# Patient Record
Sex: Female | Born: 1960 | Smoking: Current every day smoker
Health system: Southern US, Community
[De-identification: ages and names within clinical notes are randomized; demographics above are authoritative.]

## PROBLEM LIST (undated history)

## (undated) DIAGNOSIS — C801 Malignant (primary) neoplasm, unspecified: Secondary | ICD-10-CM

## (undated) DIAGNOSIS — R519 Headache, unspecified: Secondary | ICD-10-CM

## (undated) DIAGNOSIS — Z9289 Personal history of other medical treatment: Secondary | ICD-10-CM

## (undated) DIAGNOSIS — F32A Depression, unspecified: Secondary | ICD-10-CM

## (undated) DIAGNOSIS — Z8489 Family history of other specified conditions: Secondary | ICD-10-CM

## (undated) DIAGNOSIS — J189 Pneumonia, unspecified organism: Secondary | ICD-10-CM

## (undated) DIAGNOSIS — M1990 Unspecified osteoarthritis, unspecified site: Secondary | ICD-10-CM

## (undated) DIAGNOSIS — F329 Major depressive disorder, single episode, unspecified: Secondary | ICD-10-CM

## (undated) DIAGNOSIS — K219 Gastro-esophageal reflux disease without esophagitis: Secondary | ICD-10-CM

## (undated) DIAGNOSIS — F419 Anxiety disorder, unspecified: Secondary | ICD-10-CM

## (undated) DIAGNOSIS — I10 Essential (primary) hypertension: Secondary | ICD-10-CM

## (undated) DIAGNOSIS — A6920 Lyme disease, unspecified: Secondary | ICD-10-CM

## (undated) HISTORY — PX: COLONOSCOPY: SHX174

## (undated) HISTORY — PX: KNEE SURGERY: SHX244

---
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
## 1996-05-16 HISTORY — PX: TIBIA FRACTURE SURGERY: SHX806

## 1996-05-16 HISTORY — PX: SPLENECTOMY, PARTIAL: SHX787

## 2016-07-14 DIAGNOSIS — A6920 Lyme disease, unspecified: Secondary | ICD-10-CM

## 2016-07-14 HISTORY — DX: Lyme disease, unspecified: A69.20

## 2018-04-15 HISTORY — PX: OTHER SURGICAL HISTORY: SHX169

## 2019-01-30 ENCOUNTER — Other Ambulatory Visit: Payer: Self-pay | Admitting: Neurological Surgery

## 2019-02-22 ENCOUNTER — Other Ambulatory Visit: Payer: Self-pay | Admitting: Neurological Surgery

## 2019-02-22 NOTE — Progress Notes (Signed)
[id number] - [place], [place] - [address] [place] [address] Phone: [phone] Fax: [phone]      Your procedure is scheduled on Wednesday, Oct. 14th.  Report to [facility] Main Entrance "A" at 12:25 A.M., and check in at the Admitting office.  Call this number if you have problems the morning of surgery:  [phone]  Call [phone] if you have any questions prior to your surgery date Monday-Friday 8am-4pm    Remember:  Do not eat or drink after midnight the night before your surgery     Take these medicines the morning of surgery with A SIP OF WATER   Fluoxetine (Prozac)  Ceftin  Doxycycline  Famotidine (Pepcid)  7 days prior to surgery STOP taking any Aspirin (unless otherwise instructed by your surgeon), Aleve, Naproxen, Ibuprofen, Motrin, Advil, Goody's, BC's, all herbal medications, fish oil, and all vitamins.    The Morning of Surgery  Do not wear jewelry, make-up or nail polish.  Do not wear lotions, powders, or perfumes, or deodorant  Do not shave 48 hours prior to surgery.    Do not bring valuables to the hospital.  [facility] is not responsible for any belongings or valuables.  If you are a smoker, DO NOT Smoke 24 hours prior to surgery IF you wear a CPAP at night please bring your mask, tubing, and machine the morning of surgery   Remember that you must have someone to transport you home after your surgery, and remain with you for 24 hours if you are discharged the same day.   Contacts, glasses, hearing aids, dentures or bridgework may not be worn into surgery.    Leave your suitcase in the car.  After surgery it may be brought to your room.  For patients admitted to the hospital, discharge time will be determined by your treatment team.  Patients discharged the day of surgery will not be allowed to drive home.    Special instructions:   [facility]- Preparing For Surgery  Before surgery, you can  play an important role. Because skin is not sterile, your skin needs to be as free of germs as possible. You can reduce the number of germs on your skin by washing with CHG (chlorahexidine gluconate) Soap before surgery.  CHG is an antiseptic cleaner which kills germs and bonds with the skin to continue killing germs even after washing.    Oral Hygiene is also important to reduce your risk of infection.  Remember - BRUSH YOUR TEETH THE MORNING OF SURGERY WITH YOUR REGULAR TOOTHPASTE  Please do not use if you have an allergy to CHG or antibacterial soaps. If your skin becomes reddened/irritated stop using the CHG.  Do not shave (including legs and underarms) for at least 48 hours prior to first CHG shower. It is OK to shave your face.  Please follow these instructions carefully.   1. Shower the NIGHT BEFORE SURGERY and the MORNING OF SURGERY with CHG Soap.   2. If you chose to wash your hair, wash your hair first as usual with your normal shampoo.  3. After you shampoo, rinse your hair and body thoroughly to remove the shampoo.  4. Use CHG as you would any other liquid soap. You can apply CHG directly to the skin and wash gently with a scrungie or a clean washcloth.   5. Apply the CHG Soap to your body ONLY FROM THE NECK DOWN.  Do not use on open wounds or  open sores. Avoid contact with your eyes, ears, mouth and genitals (private parts). Wash Face and genitals (private parts)  with your normal soap.   6. Wash thoroughly, paying special attention to the area where your surgery will be performed.  7. Thoroughly rinse your body with warm water from the neck down.  8. DO NOT shower/wash with your normal soap after using and rinsing off the CHG Soap.  9. Pat yourself dry with a CLEAN TOWEL.  10. Wear CLEAN PAJAMAS to bed the night before surgery, wear comfortable clothes the morning of surgery  11. Place CLEAN SHEETS on your bed the night of your first shower and DO NOT SLEEP WITH  PETS.    Day of Surgery:  Do not apply any deodorants/lotions. Please shower the morning of surgery with the CHG soap  Please wear clean clothes to the hospital/surgery center.   Remember to brush your teeth WITH YOUR REGULAR TOOTHPASTE.   Please read over the following fact sheets that you were given.

## 2019-02-22 NOTE — Progress Notes (Signed)
Your procedure is scheduled on Wednesday, Oct. 14th.  Report to [facility] Main Entrance "A" at 12:25 A.M., and check in at the Admitting office.             Your surgery or procedure is scheduled for 2:45 PM   Call this number if you have problems the morning of surgery:  [phone]  Call [phone] if you have any questions prior to your surgery date Monday-Friday 8am-4pm   Remember:  Do not eat or drink after midnight the night before your surgery   Take these medicines the morning of surgery with A SIP OF WATER   Fluoxetine (Prozac)  Ceftin  Doxycycline  Famotidine (Pepcid)  7 days prior to surgery STOP taking any Aspirin (unless otherwise instructed by your surgeon), Aleve, Naproxen, Ibuprofen, Motrin, Advil, Goody's, BC's, all herbal medications, fish oil, and all vitamins.    The Morning of Surgery  Do not wear jewelry, make-up or nail polish.  Do not wear lotions, powders, or perfumes, or deodorant  Do not shave 48 hours prior to surgery.    Do not bring valuables to the hospital.  [facility] is not responsible for any belongings or valuables.  If you are a smoker, DO NOT Smoke 24 hours prior to surgery IF you wear a CPAP at night please bring your mask, tubing, and machine the morning of surgery   Remember that you must have someone to transport you home after your surgery, and remain with you for 24 hours if you are discharged the same day.   Contacts, glasses, hearing aids, dentures or bridgework may not be worn into surgery.    Leave your suitcase in the car.  After surgery it may be brought to your room.  For patients admitted to the hospital, discharge time will be determined by your treatment team.  Patients discharged the day of surgery will not be allowed to drive home.    Special instructions:   [facility]- Preparing For Surgery  Before surgery, you can play an important role. Because skin is not sterile, your skin needs to be as free of  germs as possible. You can reduce the number of germs on your skin by washing with CHG (chlorahexidine gluconate) Soap before surgery.  CHG is an antiseptic cleaner which kills germs and bonds with the skin to continue killing germs even after washing.    Oral Hygiene is also important to reduce your risk of infection.  Remember - BRUSH YOUR TEETH THE MORNING OF SURGERY WITH YOUR REGULAR TOOTHPASTE  Please do not use if you have an allergy to CHG or antibacterial soaps. If your skin becomes reddened/irritated stop using the CHG.  Do not shave (including legs and underarms) for at least 48 hours prior to first CHG shower. It is OK to shave your face.  Please follow these instructions carefully.   1. Shower the NIGHT BEFORE SURGERY and the MORNING OF SURGERY with CHG Soap.   2. If you chose to wash your hair, wash your hair first as usual with your normal shampoo.  3. After you shampoo, wash your face and private area with the soap you use at home, then rinse your hair and body thoroughly to remove the shampoo and soap. \  4. Use CHG as you would any other liquid soap. You can apply CHG directly to the skin and wash gently with a scrungie or a clean washcloth.   5. Apply the CHG Soap to your body  ONLY FROM THE NECK DOWN.  Do not use on open wounds or open sores. Avoid contact with your eyes, ears, mouth and genitals (private parts).   6. Wash thoroughly, paying special attention to the area where your surgery will be performed.  7. Thoroughly rinse your body with warm water from the neck down.  8. DO NOT shower/wash with your normal soap after using and rinsing off the CHG Soap.  9. Pat yourself dry with a CLEAN TOWEL.  10. Wear CLEAN PAJAMAS to bed the night before surgery, wear comfortable clothes the morning of surgery  11. Place CLEAN SHEETS on your bed the night of your first shower and DO NOT SLEEP WITH PETS.  Day of Surgery: Shower as instructed above. Do not apply any  deodorants/lotions, powders and colognes. Please shower the morning of surgery with the CHG soap  Please wear clean clothes to the hospital/surgery center.   Remember to brush your teeth WITH YOUR REGULAR TOOTHPASTE.   Do not wear jewelry, make-up or nail polish.  Do not shave 48 hours prior to surgery.    Do not bring valuables to the hospital.  [facility] is not responsible for any belongings or valuables.  If you are a smoker, DO NOT Smoke 24 hours prior to surgery IF you wear a CPAP at night please bring your mask, tubing, and machine the morning of surgery   Remember that you must have someone to transport you home after your surgery, and remain with you for 24 hours if you are discharged the same day.  Contacts, glasses, hearing aids, dentures or bridgework may not be worn into surgery.   Leave your suitcase in the car.  After surgery it may be brought to your room.  For patients admitted to the hospital, discharge time will be determined by your treatment team.  Patients discharged the day of surgery will not be allowed to drive home.   Special instructions:   [facility]- Preparing For Surgery  Before surgery, you can play an important role. Because skin is not sterile, your skin needs to be as free of germs as possible. You can reduce the number of germs on your skin by washing with CHG (chlorahexidine gluconate) Soap before surgery.  CHG is an antiseptic cleaner which kills germs and bonds with the skin to continue killing germs even after washing.    Please read over the following fact sheets that you were given.

## 2019-02-25 ENCOUNTER — Encounter (HOSPITAL_COMMUNITY)
Admission: RE | Admit: 2019-02-25 | Discharge: 2019-02-25 | Disposition: A | Discharge status: Home / Self Care | Payer: Medicare PPO | Source: Ambulatory Visit | Attending: Neurological Surgery | Admitting: Neurological Surgery

## 2019-02-25 ENCOUNTER — Encounter (HOSPITAL_COMMUNITY): Payer: Self-pay

## 2019-02-25 ENCOUNTER — Other Ambulatory Visit: Payer: Self-pay

## 2019-02-25 ENCOUNTER — Other Ambulatory Visit (HOSPITAL_COMMUNITY)
Admission: RE | Admit: 2019-02-25 | Discharge: 2019-02-25 | Disposition: A | Discharge status: Home / Self Care | Payer: Medicare PPO | Source: Ambulatory Visit

## 2019-02-25 ENCOUNTER — Ambulatory Visit (HOSPITAL_COMMUNITY)
Admission: RE | Admit: 2019-02-25 | Discharge: 2019-02-25 | Disposition: A | Discharge status: Home / Self Care | Payer: Medicare PPO | Source: Ambulatory Visit | Attending: Neurological Surgery | Admitting: Neurological Surgery

## 2019-02-25 DIAGNOSIS — M21379 Foot drop, unspecified foot: Secondary | ICD-10-CM

## 2019-02-25 HISTORY — DX: Headache, unspecified: R51.9

## 2019-02-25 HISTORY — DX: Essential (primary) hypertension: I10

## 2019-02-25 HISTORY — DX: Depression, unspecified: F32.A

## 2019-02-25 HISTORY — DX: Gastro-esophageal reflux disease without esophagitis: K21.9

## 2019-02-25 HISTORY — DX: Personal history of other medical treatment: Z92.89

## 2019-02-25 HISTORY — DX: Malignant (primary) neoplasm, unspecified: C80.1

## 2019-02-25 HISTORY — DX: Unspecified osteoarthritis, unspecified site: M19.90

## 2019-02-25 HISTORY — DX: Anxiety disorder, unspecified: F41.9

## 2019-02-25 LAB — CBC WITH DIFFERENTIAL/PLATELET
Abs Immature Granulocytes: 0.03 10*3/uL (ref 0.00–0.07)
Basophils Absolute: 0.1 10*3/uL (ref 0.0–0.1)
Basophils Relative: 1 %
Eosinophils Absolute: 0.2 10*3/uL (ref 0.0–0.5)
Eosinophils Relative: 2 %
HCT: 35.8 % — ABNORMAL LOW (ref 36.0–46.0)
Hemoglobin: 11.6 g/dL — ABNORMAL LOW (ref 12.0–15.0)
Immature Granulocytes: 0 %
Lymphocytes Relative: 31 %
Lymphs Abs: 2.7 10*3/uL (ref 0.7–4.0)
MCH: 33.6 pg (ref 26.0–34.0)
MCHC: 32.4 g/dL (ref 30.0–36.0)
MCV: 103.8 fL — ABNORMAL HIGH (ref 80.0–100.0)
Monocytes Absolute: 0.9 10*3/uL (ref 0.1–1.0)
Monocytes Relative: 10 %
Neutro Abs: 4.9 10*3/uL (ref 1.7–7.7)
Neutrophils Relative %: 56 %
Platelets: 294 10*3/uL (ref 150–400)
RBC: 3.45 MIL/uL — ABNORMAL LOW (ref 3.87–5.11)
RDW: 13.1 % (ref 11.5–15.5)
WBC: 8.7 10*3/uL (ref 4.0–10.5)
nRBC: 0 % (ref 0.0–0.2)

## 2019-02-25 LAB — SURGICAL PCR SCREEN
MRSA, PCR: NEGATIVE
Staphylococcus aureus: NEGATIVE

## 2019-02-25 LAB — BASIC METABOLIC PANEL
Anion gap: 10 (ref 5–15)
BUN: 15 mg/dL (ref 6–20)
CO2: 25 mmol/L (ref 22–32)
Calcium: 9.6 mg/dL (ref 8.9–10.3)
Chloride: 105 mmol/L (ref 98–111)
Creatinine, Ser: 0.99 mg/dL (ref 0.44–1.00)
GFR calc Af Amer: >60 mL/min (ref >60)
GFR calc non Af Amer: >60 mL/min (ref >60)
Glucose, Bld: 89 mg/dL (ref 70–99)
Potassium: 3.3 mmol/L — ABNORMAL LOW (ref 3.5–5.1)
Sodium: 140 mmol/L (ref 135–145)

## 2019-02-25 LAB — PROTIME-INR
INR: 1.1 (ref 0.8–1.2)
Prothrombin Time: 13.6 seconds (ref 11.4–15.2)

## 2019-02-25 LAB — TYPE AND SCREEN
ABO/RH(D): A POS
Antibody Screen: NEGATIVE

## 2019-02-25 LAB — ABO/RH: ABO/RH(D): A POS

## 2019-02-25 NOTE — Progress Notes (Signed)
PCP - [facility]- sees a PA  Cardiologist - no  Chest x-ray - 02/25/2019- pending  EKG - 02/25/2019  Stress Test - noECHO -   Cardiac Cath - no  Sleep Study ->15 years ago- negative CPAP - no  LABS-CBC, BMP, PT/INR, T/S  ASA-no  ERAS-no  HA1C-na Fasting Blood Sugar - na Checks Blood Sugar ____no_ times a day Anesthesia-  Pt denies having chest pain, sob, or fever at this time. All instructions explained to the pt, with a verbal understanding of the material. Pt agrees to go over the instructions while at home for a better understanding. Pt also instructed to self quarantine after being tested for COVID-19. The opportunity to ask questions was provided.

## 2019-02-26 LAB — SARS CORONAVIRUS 2 (TAT 6-24 HRS): SARS Coronavirus 2: NEGATIVE

## 2019-02-27 ENCOUNTER — Other Ambulatory Visit: Payer: Self-pay

## 2019-02-27 ENCOUNTER — Inpatient Hospital Stay (HOSPITAL_COMMUNITY): Payer: Medicare PPO

## 2019-02-27 ENCOUNTER — Inpatient Hospital Stay (HOSPITAL_COMMUNITY)
Admission: RE | Admit: 2019-02-27 | Discharge: 2019-02-28 | DRG: 455 | Disposition: A | Discharge status: Home / Self Care | Payer: Medicare PPO | Attending: Neurological Surgery | Admitting: Neurological Surgery

## 2019-02-27 ENCOUNTER — Inpatient Hospital Stay (HOSPITAL_COMMUNITY): Payer: Medicare PPO | Admitting: Physician Assistant

## 2019-02-27 ENCOUNTER — Inpatient Hospital Stay (HOSPITAL_COMMUNITY): Payer: Medicare PPO | Admitting: Certified Registered"

## 2019-02-27 ENCOUNTER — Encounter (HOSPITAL_COMMUNITY)
Admission: RE | Disposition: A | Discharge status: Home / Self Care | Payer: Self-pay | Source: Home / Self Care | Attending: Neurological Surgery

## 2019-02-27 ENCOUNTER — Encounter (HOSPITAL_COMMUNITY): Payer: Self-pay

## 2019-02-27 DIAGNOSIS — F419 Anxiety disorder, unspecified: Secondary | ICD-10-CM | POA: Diagnosis present

## 2019-02-27 DIAGNOSIS — Z20828 Contact with and (suspected) exposure to other viral communicable diseases: Secondary | ICD-10-CM | POA: Diagnosis present

## 2019-02-27 DIAGNOSIS — Z87891 Personal history of nicotine dependence: Secondary | ICD-10-CM | POA: Diagnosis not present

## 2019-02-27 DIAGNOSIS — I10 Essential (primary) hypertension: Secondary | ICD-10-CM | POA: Diagnosis present

## 2019-02-27 DIAGNOSIS — M5116 Intervertebral disc disorders with radiculopathy, lumbar region: Secondary | ICD-10-CM | POA: Diagnosis present

## 2019-02-27 DIAGNOSIS — Z880 Allergy status to penicillin: Secondary | ICD-10-CM

## 2019-02-27 DIAGNOSIS — M48061 Spinal stenosis, lumbar region without neurogenic claudication: Principal | ICD-10-CM | POA: Diagnosis present

## 2019-02-27 DIAGNOSIS — Z79899 Other long term (current) drug therapy: Secondary | ICD-10-CM | POA: Diagnosis not present

## 2019-02-27 DIAGNOSIS — Z981 Arthrodesis status: Secondary | ICD-10-CM

## 2019-02-27 DIAGNOSIS — Z419 Encounter for procedure for purposes other than remedying health state, unspecified: Secondary | ICD-10-CM

## 2019-02-27 DIAGNOSIS — K219 Gastro-esophageal reflux disease without esophagitis: Secondary | ICD-10-CM | POA: Diagnosis present

## 2019-02-27 DIAGNOSIS — F329 Major depressive disorder, single episode, unspecified: Secondary | ICD-10-CM | POA: Diagnosis present

## 2019-02-27 DIAGNOSIS — M21371 Foot drop, right foot: Secondary | ICD-10-CM | POA: Diagnosis present

## 2019-02-27 DIAGNOSIS — Z9081 Acquired absence of spleen: Secondary | ICD-10-CM

## 2019-02-27 SURGERY — POSTERIOR LUMBAR FUSION 1 LEVEL
Anesthesia: General | Site: Back

## 2019-02-27 MED ORDER — LIDOCAINE 20MG/ML (2%) 15 ML SYRINGE OPTIME
INTRAMUSCULAR | Status: DC | PRN
  Administered 2019-02-27: 80 mg via INTRAVENOUS

## 2019-02-27 MED ORDER — LEVOCETIRIZINE DIHYDROCHLORIDE 5 MG PO TABS: 5.0000 mg | ORAL_TABLET | Freq: Every day | ORAL | Status: DC

## 2019-02-27 MED ORDER — ARTHREX ANGEL - ACD-A SOLUTION (CHARTING ONLY) OPTIME
TOPICAL | Status: DC | PRN
  Administered 2019-02-27: 10 mL via TOPICAL

## 2019-02-27 MED ORDER — PROPOFOL 10 MG/ML IV BOLUS
INTRAVENOUS | Status: AC
  Filled 2019-02-27: qty 20

## 2019-02-27 MED ORDER — PROMETHAZINE HCL 25 MG/ML IJ SOLN: 6.2500 mg | INTRAMUSCULAR | Status: DC | PRN

## 2019-02-27 MED ORDER — VANCOMYCIN HCL IN DEXTROSE 1-5 GM/200ML-% IV SOLN
INTRAVENOUS | Status: AC
  Administered 2019-02-27: 13:00:00 1000 mg via INTRAVENOUS
  Filled 2019-02-27: qty 200

## 2019-02-27 MED ORDER — VANCOMYCIN HCL IN DEXTROSE 1-5 GM/200ML-% IV SOLN
1000.0000 mg | INTRAVENOUS | Status: AC
  Administered 2019-02-27: 13:00:00 1000 mg via INTRAVENOUS

## 2019-02-27 MED ORDER — SODIUM CHLORIDE 0.9 % IV SOLN: 250.0000 mL | INTRAVENOUS | Status: DC

## 2019-02-27 MED ORDER — MORPHINE SULFATE (PF) 2 MG/ML IV SOLN
2.0000 mg | INTRAVENOUS | Status: DC | PRN
  Administered 2019-02-27: 19:00:00 2 mg via INTRAVENOUS
  Filled 2019-02-27: qty 1

## 2019-02-27 MED ORDER — LIDOCAINE 2% (20 MG/ML) 5 ML SYRINGE
INTRAMUSCULAR | Status: AC
  Filled 2019-02-27: qty 5

## 2019-02-27 MED ORDER — ONDANSETRON HCL 4 MG/2ML IJ SOLN
INTRAMUSCULAR | Status: AC
  Filled 2019-02-27: qty 2

## 2019-02-27 MED ORDER — BUPIVACAINE HCL (PF) 0.25 % IJ SOLN
INTRAMUSCULAR | Status: AC
  Filled 2019-02-27: qty 30

## 2019-02-27 MED ORDER — ACETAMINOPHEN 325 MG PO TABS: 650.0000 mg | ORAL_TABLET | ORAL | Status: DC | PRN

## 2019-02-27 MED ORDER — FENTANYL CITRATE (PF) 100 MCG/2ML IJ SOLN
25.0000 ug | INTRAMUSCULAR | Status: DC | PRN
  Administered 2019-02-27: 25 ug via INTRAVENOUS

## 2019-02-27 MED ORDER — SENNA 8.6 MG PO TABS
1.0000 | ORAL_TABLET | Freq: Two times a day (BID) | ORAL | Status: DC
  Administered 2019-02-27 – 2019-02-28 (×2): 8.6 mg via ORAL
  Filled 2019-02-27 (×2): qty 1

## 2019-02-27 MED ORDER — CHLORHEXIDINE GLUCONATE CLOTH 2 % EX PADS: 6.0000 | MEDICATED_PAD | Freq: Once | CUTANEOUS | Status: DC

## 2019-02-27 MED ORDER — SODIUM CHLORIDE (PF) 0.9 % IJ SOLN
INTRAMUSCULAR | Status: DC | PRN
  Administered 2019-02-27: 5 mL

## 2019-02-27 MED ORDER — PHENOL 1.4 % MT LIQD: 1.0000 | OROMUCOSAL | Status: DC | PRN

## 2019-02-27 MED ORDER — PROPOFOL 10 MG/ML IV BOLUS
INTRAVENOUS | Status: DC | PRN
  Administered 2019-02-27: 150 mg via INTRAVENOUS

## 2019-02-27 MED ORDER — MENTHOL 3 MG MT LOZG
1.0000 | LOZENGE | OROMUCOSAL | Status: DC | PRN
  Filled 2019-02-27: qty 9

## 2019-02-27 MED ORDER — METHOCARBAMOL 1000 MG/10ML IJ SOLN
500.0000 mg | Freq: Four times a day (QID) | INTRAVENOUS | Status: DC | PRN
  Filled 2019-02-27: qty 5

## 2019-02-27 MED ORDER — MIDAZOLAM HCL 2 MG/2ML IJ SOLN
INTRAMUSCULAR | Status: AC
  Filled 2019-02-27: qty 2

## 2019-02-27 MED ORDER — CEFAZOLIN SODIUM-DEXTROSE 2-4 GM/100ML-% IV SOLN: 2.0000 g | INTRAVENOUS | Status: DC

## 2019-02-27 MED ORDER — GLYCOPYRROLATE 0.2 MG/ML IJ SOLN
INTRAMUSCULAR | Status: DC | PRN
  Administered 2019-02-27: 0.2 mg via INTRAVENOUS

## 2019-02-27 MED ORDER — LORATADINE 10 MG PO TABS
10.0000 mg | ORAL_TABLET | Freq: Every day | ORAL | Status: DC
  Administered 2019-02-28: 10 mg via ORAL
  Filled 2019-02-27: qty 1

## 2019-02-27 MED ORDER — SODIUM CHLORIDE 0.9% FLUSH: 3.0000 mL | INTRAVENOUS | Status: DC | PRN

## 2019-02-27 MED ORDER — ACETAMINOPHEN 500 MG PO TABS
1000.0000 mg | ORAL_TABLET | Freq: Once | ORAL | Status: AC
  Administered 2019-02-27: 13:00:00 1000 mg via ORAL

## 2019-02-27 MED ORDER — ROCURONIUM BROMIDE 10 MG/ML (PF) SYRINGE
PREFILLED_SYRINGE | INTRAVENOUS | Status: DC | PRN
  Administered 2019-02-27: 40 mg via INTRAVENOUS
  Administered 2019-02-27: 60 mg via INTRAVENOUS
  Administered 2019-02-27: 20 mg via INTRAVENOUS

## 2019-02-27 MED ORDER — CELECOXIB 200 MG PO CAPS
200.0000 mg | ORAL_CAPSULE | Freq: Two times a day (BID) | ORAL | Status: DC
  Administered 2019-02-27 – 2019-02-28 (×2): 200 mg via ORAL
  Filled 2019-02-27 (×2): qty 1

## 2019-02-27 MED ORDER — DEXAMETHASONE SODIUM PHOSPHATE 10 MG/ML IJ SOLN: 10.0000 mg | Freq: Once | INTRAMUSCULAR | Status: DC

## 2019-02-27 MED ORDER — FENTANYL CITRATE (PF) 250 MCG/5ML IJ SOLN
INTRAMUSCULAR | Status: AC
  Filled 2019-02-27: qty 5

## 2019-02-27 MED ORDER — ONDANSETRON HCL 4 MG PO TABS: 4.0000 mg | ORAL_TABLET | Freq: Four times a day (QID) | ORAL | Status: DC | PRN

## 2019-02-27 MED ORDER — THROMBIN 5000 UNITS EX SOLR
CUTANEOUS | Status: AC
  Filled 2019-02-27: qty 5000

## 2019-02-27 MED ORDER — EPHEDRINE SULFATE 50 MG/ML IJ SOLN
INTRAMUSCULAR | Status: DC | PRN
  Administered 2019-02-27: 5 mg via INTRAVENOUS
  Administered 2019-02-27: 10 mg via INTRAVENOUS
  Administered 2019-02-27: 5 mg via INTRAVENOUS

## 2019-02-27 MED ORDER — LACTATED RINGERS IV SOLN
INTRAVENOUS | Status: DC
  Administered 2019-02-27 (×2): via INTRAVENOUS

## 2019-02-27 MED ORDER — FENTANYL CITRATE (PF) 100 MCG/2ML IJ SOLN
INTRAMUSCULAR | Status: DC | PRN
  Administered 2019-02-27: 50 ug via INTRAVENOUS
  Administered 2019-02-27: 100 ug via INTRAVENOUS
  Administered 2019-02-27 (×2): 50 ug via INTRAVENOUS

## 2019-02-27 MED ORDER — CEFDINIR 300 MG PO CAPS
300.0000 mg | ORAL_CAPSULE | Freq: Two times a day (BID) | ORAL | Status: DC
  Administered 2019-02-27 – 2019-02-28 (×2): 300 mg via ORAL
  Filled 2019-02-27 (×5): qty 1

## 2019-02-27 MED ORDER — FENTANYL CITRATE (PF) 100 MCG/2ML IJ SOLN
INTRAMUSCULAR | Status: AC
  Filled 2019-02-27: qty 2

## 2019-02-27 MED ORDER — DIPHENHYDRAMINE HCL 50 MG/ML IJ SOLN
INTRAMUSCULAR | Status: AC
  Filled 2019-02-27: qty 1

## 2019-02-27 MED ORDER — ONDANSETRON HCL 4 MG/2ML IJ SOLN: 4.0000 mg | Freq: Four times a day (QID) | INTRAMUSCULAR | Status: DC | PRN

## 2019-02-27 MED ORDER — DOXYCYCLINE HYCLATE 100 MG PO TABS
100.0000 mg | ORAL_TABLET | Freq: Two times a day (BID) | ORAL | Status: DC
  Administered 2019-02-27 – 2019-02-28 (×2): 100 mg via ORAL
  Filled 2019-02-27 (×4): qty 1

## 2019-02-27 MED ORDER — SUGAMMADEX SODIUM 200 MG/2ML IV SOLN
INTRAVENOUS | Status: DC | PRN
  Administered 2019-02-27: 200 mg via INTRAVENOUS

## 2019-02-27 MED ORDER — SODIUM CHLORIDE 0.9 % IV SOLN
INTRAVENOUS | Status: DC | PRN
  Administered 2019-02-27: 16:00:00 500 mL

## 2019-02-27 MED ORDER — 0.9 % SODIUM CHLORIDE (POUR BTL) OPTIME
TOPICAL | Status: DC | PRN
  Administered 2019-02-27: 1000 mL

## 2019-02-27 MED ORDER — DEXAMETHASONE SODIUM PHOSPHATE 4 MG/ML IJ SOLN
4.0000 mg | Freq: Four times a day (QID) | INTRAMUSCULAR | Status: DC
  Administered 2019-02-27 – 2019-02-28 (×3): 4 mg via INTRAVENOUS
  Filled 2019-02-27 (×3): qty 1

## 2019-02-27 MED ORDER — DEXAMETHASONE SODIUM PHOSPHATE 10 MG/ML IJ SOLN
10.0000 mg | Freq: Once | INTRAMUSCULAR | Status: AC
  Administered 2019-02-27: 10 mg via INTRAVENOUS

## 2019-02-27 MED ORDER — POTASSIUM CHLORIDE IN NACL 20-0.9 MEQ/L-% IV SOLN: INTRAVENOUS | Status: DC

## 2019-02-27 MED ORDER — FLUOXETINE HCL 20 MG PO CAPS
40.0000 mg | ORAL_CAPSULE | Freq: Two times a day (BID) | ORAL | Status: DC
  Administered 2019-02-27 – 2019-02-28 (×2): 40 mg via ORAL
  Filled 2019-02-27 (×2): qty 2

## 2019-02-27 MED ORDER — LISINOPRIL 10 MG PO TABS
10.0000 mg | ORAL_TABLET | Freq: Every day | ORAL | Status: DC
  Administered 2019-02-28: 10 mg via ORAL
  Filled 2019-02-27: qty 1

## 2019-02-27 MED ORDER — FAMOTIDINE 20 MG PO TABS
40.0000 mg | ORAL_TABLET | Freq: Two times a day (BID) | ORAL | Status: DC
  Administered 2019-02-27 – 2019-02-28 (×2): 40 mg via ORAL
  Filled 2019-02-27 (×2): qty 2

## 2019-02-27 MED ORDER — METHOCARBAMOL 500 MG PO TABS
500.0000 mg | ORAL_TABLET | Freq: Four times a day (QID) | ORAL | Status: DC | PRN
  Administered 2019-02-27 – 2019-02-28 (×4): 500 mg via ORAL
  Filled 2019-02-27 (×4): qty 1

## 2019-02-27 MED ORDER — ONDANSETRON HCL 4 MG/2ML IJ SOLN
INTRAMUSCULAR | Status: DC | PRN
  Administered 2019-02-27: 4 mg via INTRAVENOUS

## 2019-02-27 MED ORDER — DEXAMETHASONE SODIUM PHOSPHATE 10 MG/ML IJ SOLN
INTRAMUSCULAR | Status: AC
  Filled 2019-02-27: qty 1

## 2019-02-27 MED ORDER — THROMBIN 20000 UNITS EX SOLR
CUTANEOUS | Status: AC
  Filled 2019-02-27: qty 20000

## 2019-02-27 MED ORDER — DEXAMETHASONE SODIUM PHOSPHATE 10 MG/ML IJ SOLN
INTRAMUSCULAR | Status: DC | PRN
  Administered 2019-02-27: 10 mg via INTRAVENOUS

## 2019-02-27 MED ORDER — DISULFIRAM 250 MG PO TABS
125.0000 mg | ORAL_TABLET | Freq: Every day | ORAL | Status: DC
  Filled 2019-02-27: qty 1

## 2019-02-27 MED ORDER — VANCOMYCIN HCL IN DEXTROSE 1-5 GM/200ML-% IV SOLN
1000.0000 mg | Freq: Once | INTRAVENOUS | Status: AC
  Administered 2019-02-27: 1000 mg via INTRAVENOUS
  Filled 2019-02-27: qty 200

## 2019-02-27 MED ORDER — ROCURONIUM BROMIDE 10 MG/ML (PF) SYRINGE
PREFILLED_SYRINGE | INTRAVENOUS | Status: AC
  Filled 2019-02-27: qty 10

## 2019-02-27 MED ORDER — DIPHENHYDRAMINE HCL 50 MG/ML IJ SOLN
INTRAMUSCULAR | Status: DC | PRN
  Administered 2019-02-27: 12.5 mg via INTRAVENOUS

## 2019-02-27 MED ORDER — OXYCODONE HCL 5 MG PO TABS
5.0000 mg | ORAL_TABLET | ORAL | Status: DC | PRN
  Administered 2019-02-27 – 2019-02-28 (×6): 5 mg via ORAL
  Filled 2019-02-27 (×6): qty 1

## 2019-02-27 MED ORDER — THROMBIN 20000 UNITS EX SOLR
CUTANEOUS | Status: DC | PRN
  Administered 2019-02-27: 16:00:00 via TOPICAL

## 2019-02-27 MED ORDER — ACETAMINOPHEN 650 MG RE SUPP: 650.0000 mg | RECTAL | Status: DC | PRN

## 2019-02-27 MED ORDER — CEFAZOLIN SODIUM-DEXTROSE 2-4 GM/100ML-% IV SOLN
INTRAVENOUS | Status: AC
  Filled 2019-02-27: qty 100

## 2019-02-27 MED ORDER — DEXAMETHASONE 4 MG PO TABS
4.0000 mg | ORAL_TABLET | Freq: Four times a day (QID) | ORAL | Status: DC
  Administered 2019-02-28: 13:00:00 4 mg via ORAL
  Filled 2019-02-27: qty 1

## 2019-02-27 MED ORDER — HEPARIN SODIUM (PORCINE) 1000 UNIT/ML IJ SOLN
INTRAMUSCULAR | Status: DC | PRN
  Administered 2019-02-27: 5000 [IU] via INTRAVENOUS

## 2019-02-27 MED ORDER — SODIUM CHLORIDE 0.9% FLUSH
3.0000 mL | Freq: Two times a day (BID) | INTRAVENOUS | Status: DC
  Administered 2019-02-27: 22:00:00 3 mL via INTRAVENOUS

## 2019-02-27 MED ORDER — MIDAZOLAM HCL 5 MG/5ML IJ SOLN
INTRAMUSCULAR | Status: DC | PRN
  Administered 2019-02-27: 2 mg via INTRAVENOUS

## 2019-02-27 MED ORDER — BUPIVACAINE HCL (PF) 0.25 % IJ SOLN
INTRAMUSCULAR | Status: DC | PRN
  Administered 2019-02-27: 6 mL

## 2019-02-27 MED ORDER — ACETAMINOPHEN 500 MG PO TABS
ORAL_TABLET | ORAL | Status: AC
  Administered 2019-02-27: 1000 mg via ORAL
  Filled 2019-02-27: qty 2

## 2019-02-27 MED ORDER — THROMBIN 5000 UNITS EX SOLR
OROMUCOSAL | Status: DC | PRN
  Administered 2019-02-27: 5 mL via TOPICAL

## 2019-02-27 MED ORDER — SODIUM CHLORIDE 0.9 % IV SOLN
INTRAVENOUS | Status: DC | PRN
  Administered 2019-02-27: 25 ug/min via INTRAVENOUS

## 2019-02-27 MED ORDER — VANCOMYCIN HCL 1000 MG IV SOLR: INTRAVENOUS | Status: DC | PRN

## 2019-02-27 SURGICAL SUPPLY — 65 items
BAG DECANTER FOR FLEXI CONT (MISCELLANEOUS) ×3 IMPLANT
BASKET BONE COLLECTION (BASKET) ×3 IMPLANT
BENZOIN TINCTURE PRP APPL 2/3 (GAUZE/BANDAGES/DRESSINGS) ×3 IMPLANT
BLADE CLIPPER SURG (BLADE) IMPLANT
BUR MATCHSTICK NEURO 3.0 LAGG (BURR) ×3 IMPLANT
CANISTER SUCT 3000ML PPV (MISCELLANEOUS) ×3 IMPLANT
CARTRIDGE OIL MAESTRO DRILL (MISCELLANEOUS) ×1 IMPLANT
CLOSURE WOUND 1/2 X4 (GAUZE/BANDAGES/DRESSINGS) ×1
CONT SPEC 4OZ CLIKSEAL STRL BL (MISCELLANEOUS) ×3 IMPLANT
COVER BACK TABLE 60X90IN (DRAPES) ×3 IMPLANT
COVER WAND RF STERILE (DRAPES) IMPLANT
DERMABOND ADVANCED (GAUZE/BANDAGES/DRESSINGS) ×2
DERMABOND ADVANCED .7 DNX12 (GAUZE/BANDAGES/DRESSINGS) ×1 IMPLANT
DIFFUSER DRILL AIR PNEUMATIC (MISCELLANEOUS) ×3 IMPLANT
DRAPE C-ARM 42X72 X-RAY (DRAPES) ×6 IMPLANT
DRAPE C-ARMOR (DRAPES) ×3 IMPLANT
DRAPE LAPAROTOMY 100X72X124 (DRAPES) ×3 IMPLANT
DRAPE SURG 17X23 STRL (DRAPES) ×3 IMPLANT
DRSG OPSITE POSTOP 4X6 (GAUZE/BANDAGES/DRESSINGS) ×3 IMPLANT
DURAPREP 26ML APPLICATOR (WOUND CARE) ×3 IMPLANT
ELECT REM PT RETURN 9FT ADLT (ELECTROSURGICAL) ×3
ELECTRODE REM PT RTRN 9FT ADLT (ELECTROSURGICAL) ×1 IMPLANT
EVACUATOR 1/8 PVC DRAIN (DRAIN) IMPLANT
GAUZE 4X4 16PLY RFD (DISPOSABLE) IMPLANT
GLOVE BIO SURGEON STRL SZ7 (GLOVE) IMPLANT
GLOVE BIO SURGEON STRL SZ8 (GLOVE) ×6 IMPLANT
GLOVE BIOGEL PI IND STRL 7.0 (GLOVE) IMPLANT
GLOVE BIOGEL PI INDICATOR 7.0 (GLOVE)
GLOVE INDICATOR 7.0 STRL GRN (GLOVE) ×3 IMPLANT
GOWN STRL REUS W/ TWL LRG LVL3 (GOWN DISPOSABLE) IMPLANT
GOWN STRL REUS W/ TWL XL LVL3 (GOWN DISPOSABLE) ×2 IMPLANT
GOWN STRL REUS W/TWL 2XL LVL3 (GOWN DISPOSABLE) IMPLANT
GOWN STRL REUS W/TWL LRG LVL3 (GOWN DISPOSABLE)
GOWN STRL REUS W/TWL XL LVL3 (GOWN DISPOSABLE) ×4
HEMOSTAT POWDER KIT SURGIFOAM (HEMOSTASIS) ×3 IMPLANT
KIT BASIN OR (CUSTOM PROCEDURE TRAY) ×3 IMPLANT
KIT BONE MRW ASP ANGEL CPRP (KITS) ×3 IMPLANT
KIT TURNOVER KIT B (KITS) ×3 IMPLANT
MILL MEDIUM DISP (BLADE) ×3 IMPLANT
NEEDLE HYPO 18GX1.5 BLUNT FILL (NEEDLE) ×6 IMPLANT
NEEDLE HYPO 25X1 1.5 SAFETY (NEEDLE) ×3 IMPLANT
NS IRRIG 1000ML POUR BTL (IV SOLUTION) ×3 IMPLANT
OIL CARTRIDGE MAESTRO DRILL (MISCELLANEOUS) ×3
PACK LAMINECTOMY NEURO (CUSTOM PROCEDURE TRAY) ×3 IMPLANT
PAD ARMBOARD 7.5X6 YLW CONV (MISCELLANEOUS) ×9 IMPLANT
PUTTY DBM ALLOSYNC PURE 10CC (Putty) ×3 IMPLANT
ROD LORD LIPPED TI 5.5X40 (Rod) ×6 IMPLANT
SCREW MOD INVICTUS 6.5X40 (Screw) ×9 IMPLANT
SCREW POLYAXIAL TULIP (Screw) ×12 IMPLANT
SCREW SHANK MOD 6.5X45 (Screw) ×3 IMPLANT
SET SCREW (Screw) ×8 IMPLANT
SET SCREW SPNE (Screw) ×4 IMPLANT
SPACER IDENTITI PS 9X9X25 10D (Spacer) ×6 IMPLANT
SPONGE LAP 4X18 RFD (DISPOSABLE) IMPLANT
SPONGE SURGIFOAM ABS GEL 100 (HEMOSTASIS) ×3 IMPLANT
STRIP CLOSURE SKIN 1/2X4 (GAUZE/BANDAGES/DRESSINGS) ×2 IMPLANT
SUT VIC AB 0 CT1 18XCR BRD8 (SUTURE) ×1 IMPLANT
SUT VIC AB 0 CT1 8-18 (SUTURE) ×2
SUT VIC AB 2-0 CP2 18 (SUTURE) ×3 IMPLANT
SUT VIC AB 3-0 SH 8-18 (SUTURE) ×6 IMPLANT
SYR CONTROL 10ML LL (SYRINGE) ×3 IMPLANT
TOWEL GREEN STERILE (TOWEL DISPOSABLE) ×3 IMPLANT
TOWEL GREEN STERILE FF (TOWEL DISPOSABLE) ×3 IMPLANT
TRAY FOLEY MTR SLVR 16FR STAT (SET/KITS/TRAYS/PACK) ×3 IMPLANT
WATER STERILE IRR 1000ML POUR (IV SOLUTION) ×3 IMPLANT

## 2019-02-27 NOTE — Transfer of Care (Signed)
Immediate Anesthesia Transfer of Care Note  Patient: Julia Stewart  Procedure(s) Performed: Posterior Lumbar Interbody Fusion - Lumbar Four-Lumbar Five (N/A Back)  Patient Location: PACU  Anesthesia Type:General  Level of Consciousness: awake, alert  and oriented  Airway & Oxygen Therapy: Patient Spontanous Breathing and Patient connected to nasal cannula oxygen  Post-op Assessment: Report given to RN and Post -op Vital signs reviewed and stable  Post vital signs: Reviewed and stable  Last Vitals:  Vitals Value Taken Time  BP 85/60 02/27/19 1749  Temp    Pulse 77 02/27/19 1750  Resp 15 02/27/19 1750  SpO2 100 % 02/27/19 1750  Vitals shown include unvalidated device data.  Last Pain:  Vitals:   02/27/19 1256  TempSrc:   PainSc: 0-No pain      Patients Stated Pain Goal: 2 ([id number])  Complications: No apparent anesthesia complications

## 2019-02-27 NOTE — Op Note (Signed)
02/27/2019  5:31 PM  PATIENT:  Julia Stewart  58 y.o. 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 Decompressive lumbar laminectomy L4-5 requiring more work than would be required for a simple exposure of the disk for PLIF in order to adequately decompress the neural elements and address the spinal stenosis 2. Posterior lumbar interbody fusion L4-5 using porous titanium interbody cages packed with morcellized allograft and autograft soaked with bone marrow aspirate obtained through a separate fascial incision 3. Posterior fixation L4-5 using Alphatec cortical pedicle screws.  4. Intertransverse arthrodesis L4-5 using morcellized autograft and allograft.  SURGEON:  Sherley Bounds, MD  ASSISTANTS: Dr. Christella Noa  ANESTHESIA:  General  EBL: 300 ml  Total I/O In: 1000 [I.V.:1000] Out: 530 [Urine:230; Blood:300]  BLOOD ADMINISTERED:none  DRAINS: none   INDICATION FOR PROCEDURE: This patient presented with severe right leg pain with dense foot drop. Imaging revealed recurrent foraminal disc herniation and foraminal stenosis L4-5 on the right after previous surgeries. The patient tried a reasonable attempt at conservative medical measures without relief. I recommended decompression and instrumented fusion to address the stenosis as well as the segmental  instability.  Patient understood the risks, benefits, and alternatives and potential outcomes and wished to proceed.  PROCEDURE DETAILS:  The patient was brought to the operating room. After induction of generalized endotracheal anesthesia the patient was rolled into the prone position on chest rolls and all pressure points were padded. The patient's lumbar region was cleaned and then prepped with DuraPrep and draped in the usual sterile fashion.  Anesthesia was injected and then a dorsal midline incision was made and carried down to the lumbosacral fascia. The fascia was opened and the paraspinous musculature was taken down in a subperiosteal fashion to expose L4-5. A self-retaining retractor was placed. Intraoperative fluoroscopy confirmed my level, and I started with placement of the L4 cortical pedicle screws. The pedicle screw entry zones were identified utilizing surface landmarks and  AP and lateral fluoroscopy. I scored the cortex with the high-speed drill and then used the hand drill to drill an upward and outward direction into the pedicle. I then tapped line to line. I then placed a 6.5 x 40 mm cortical pedicle screw into the pedicles of L4 bilaterally.  I then dissected in a suprafascial plane to expose the iliac crest.  Open the fascia used a Jamshidi needle to extract 60 cc of bone marrow aspirate from the iliac crest.  This was then spun down by [facility] device and 2 to 4 cc of  BMAC was soaked on morselized allograft for later arthrodesis.  I dried the hole with Surgifoam and closed the fascia.  I then turned my attention to the decompression and complete lumbar laminectomies, hemi- facetectomies, and foraminotomies were performed at L4-5.  There was significant scarring at L4-5 in the right from the previous hemilaminectomy and extraforaminal decompression.  The patient had significant spinal stenosis and this required more work than would be required for a simple exposure of the disc for posterior lumbar interbody fusion which would only require a limited laminotomy. Much more generous decompression and generous foraminotomy was undertaken in order to adequately decompress the neural elements and address the patient's leg pain. The yellow ligament was removed to expose the underlying dura and nerve roots, and generous foraminotomies were performed to adequately decompress  the neural elements. Both the exiting and traversing nerve roots were  decompressed on both sides until a coronary dilator passed easily along the nerve roots.  Did find a recurrent disc herniation at L4-5 on the right in the foraminal space underneath the L4 nerve root compressing the L4 nerve root.  This was removed with a nerve hook and pituitary rongeurs.  Once the decompression was complete, I turned my attention to the posterior lower lumbar interbody fusion. The epidural venous vasculature was coagulated and cut sharply. Disc space was incised and the initial discectomy was performed with pituitary rongeurs. The disc space was distracted with sequential distractors to a height of 9 mm. We then used a series of scrapers and shavers to prepare the endplates for fusion. The midline was prepared with Epstein curettes. Once the complete discectomy was finished, we packed an appropriate sized interbody cage with local autograft and morcellized allograft, gently retracted the nerve root, and tapped the cage into position at L4-5.  The midline between the cages was packed with morselized autograft and allograft. We then turned our attention to the placement of the lower pedicle screws. The pedicle screw entry zones were identified utilizing surface landmarks and fluoroscopy. I drilled into each pedicle utilizing the hand drill, and tapped each pedicle with the appropriate tap. We palpated with a ball probe to assure no break in the cortex. We then placed 6.5 x 40 mm pedicle screws into the pedicles bilaterally at L5. We then decorticated the transverse processes and laid a mixture of morcellized autograft and allograft out over these to perform intertransverse arthrodesis at L4-5. We then placed lordotic rods into the multiaxial screw heads of the pedicle screws and locked these in position with the locking caps and anti-torque device. We then checked our construct with AP and lateral fluoroscopy.  Dr. Christella Noa was instrumental in placing the nonsegmental fixation. I irrigated with  copious amounts of bacitracin-containing saline solution. Inspected the nerve roots once again to assure adequate decompression, lined to the dura with Gelfoam, , and closed the muscle and the fascia with 0 Vicryl. Closed the subcutaneous tissues with 2-0 Vicryl and subcuticular tissues with 3-0 Vicryl. The skin was closed with benzoin and Steri-Strips. Dressing was then applied, the patient was awakened from general anesthesia and transported to the recovery room in stable condition. At the end of the procedure all sponge, needle and instrument counts were correct.   PLAN OF CARE: admit to inpatient  PATIENT DISPOSITION:  PACU - hemodynamically stable.   Delay start of Pharmacological VTE agent (>24hrs) due to surgical blood loss or risk of bleeding:  yes

## 2019-02-27 NOTE — Anesthesia Procedure Notes (Signed)
Procedure Name: Intubation Date/Time: 02/27/2019 2:47 PM Performed by: Lowella Dell, CRNA Pre-anesthesia Checklist: Patient identified, Emergency Drugs available, Suction available and Patient being monitored Patient Re-evaluated:Patient Re-evaluated prior to induction Oxygen Delivery Method: Circle System Utilized Preoxygenation: Pre-oxygenation with 100% oxygen Induction Type: IV induction Ventilation: Mask ventilation without difficulty Laryngoscope Size: Mac and 3 Grade View: Grade I Tube type: Oral Tube size: 7.0 mm Number of attempts: 1 Airway Equipment and Method: Stylet Placement Confirmation: ETT inserted through vocal cords under direct vision,  positive ETCO2 and breath sounds checked- equal and bilateral Secured at: 22 cm Tube secured with: Tape Dental Injury: Teeth and Oropharynx as per pre-operative assessment  Comments: Induction with Dr Fransisco Beau.

## 2019-02-27 NOTE — Anesthesia Preprocedure Evaluation (Addendum)
Anesthesia Evaluation  Patient identified by MRN, date of birth, ID band Patient awake    Reviewed: Allergy & Precautions, NPO status , Patient's Chart, lab work & pertinent test results  History of Anesthesia Complications Negative for: history of anesthetic complications  Airway Mallampati: II  TM Distance: >3 FB Neck ROM: Full    Dental  (+) Teeth Intact, Dental Advisory Given, Caps   Pulmonary Patient abstained from smoking., former smoker,    Pulmonary exam normal breath sounds clear to auscultation       Cardiovascular hypertension, Pt. on medications (-) angina(-) CAD, (-) Past MI and (-) CABG Normal cardiovascular exam Rhythm:Regular Rate:Normal     Neuro/Psych  Headaches, PSYCHIATRIC DISORDERS Anxiety Depression Foot drop, right    GI/Hepatic Neg liver ROS, GERD  Medicated,  Endo/Other  negative endocrine ROS  Renal/GU negative Renal ROS     Musculoskeletal  (+) Arthritis ,   Abdominal   Peds  Hematology  (+) Blood dyscrasia, anemia ,   Anesthesia Other Findings Day of surgery medications reviewed with the patient.  Reproductive/Obstetrics                            Anesthesia Physical Anesthesia Plan  ASA: II  Anesthesia Plan: General   Post-op Pain Management:    Induction: Intravenous  PONV Risk Score and Plan: 3 and Midazolam, Dexamethasone, Ondansetron and Diphenhydramine  Airway Management Planned: Oral ETT  Additional Equipment:   Intra-op Plan:   Post-operative Plan: Extubation in OR  Informed Consent: I have reviewed the patients History and Physical, chart, labs and discussed the procedure including the risks, benefits and alternatives for the proposed anesthesia with the patient or authorized representative who has indicated his/her understanding and acceptance.     Dental advisory given  Plan Discussed with: CRNA  Anesthesia Plan Comments:          Anesthesia Quick Evaluation
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
## 2019-02-28 MED ORDER — METHOCARBAMOL 500 MG PO TABS: 500.0000 mg | ORAL_TABLET | Freq: Four times a day (QID) | ORAL | 1 refills | Status: DC | PRN

## 2019-02-28 MED ORDER — CELECOXIB 200 MG PO CAPS: 200.0000 mg | ORAL_CAPSULE | Freq: Two times a day (BID) | ORAL | 0 refills | Status: DC

## 2019-02-28 MED ORDER — OXYCODONE HCL 5 MG PO TABS: 5.0000 mg | ORAL_TABLET | ORAL | 0 refills | Status: DC | PRN

## 2019-02-28 NOTE — Evaluation (Signed)
Physical Therapy Evaluation Patient Details Name: Julia Stewart MRN: [id number] DOB: Jul 03, 1960 Today's Date: 02/28/2019   History of Present Illness  Pt is a 58yo female s/p Decompressive lumbar laminectomy L4-5 and PLIF L4-5. PMHx: anxiety, HTN, lumbar sx.  Clinical Impression  Pt admitted with above diagnosis. At the time of PT eval, pt was able to demonstrate transfers and ambulation with gross min guard assist to supervision for safety with RW for support. She reports improvement in foot drop compared to baseline but still appeared pretty limited in DF. Pt was educated on precautions, brace application/wearing schedule, appropriate activity progression, and car transfer. Pt currently with functional limitations due to the deficits listed below (see PT Problem List). Pt will benefit from skilled PT to increase their independence and safety with mobility to allow discharge to the venue listed below.       Follow Up Recommendations No PT follow up    Equipment Recommendations  Rolling walker with 5" wheels    Recommendations for Other Services       Precautions / Restrictions Precautions Precautions: Back Precaution Booklet Issued: Yes (comment) Precaution Comments: Reviewed verbally throughout functional mobility.  Required Braces or Orthoses: Spinal Brace Spinal Brace: Thoracolumbosacral orthotic Restrictions Weight Bearing Restrictions: No      Mobility  Bed Mobility Overal bed mobility: Needs Assistance Bed Mobility: Sidelying to Sit;Rolling Rolling: Supervision Sidelying to sit: Supervision       General bed mobility comments: VC's for improved log roll technique.   Transfers Overall transfer level: Needs assistance Equipment used: None Transfers: Sit to/from Stand Sit to Stand: Supervision         General transfer comment: Close supervision for power-up to full stand. VC's for hand placement on seated surface for safety.    Ambulation/Gait Ambulation/Gait assistance: Supervision Gait Distance (Feet): 225 Feet Assistive device: Rolling walker (2 wheeled) Gait Pattern/deviations: Step-through pattern;Decreased stride length;Trunk flexed Gait velocity: Decreased Gait velocity interpretation: <1.8 ft/sec, indicate of risk for recurrent falls General Gait Details: VC's for improved posture, closer walker proximity and forward gaze. No assist required but close supervision provided as pt has foot drop and was not wearing tennis shoes. Pt reports falls at home when not wearing her shoes.   Stairs Stairs: Yes Stairs assistance: Min guard Stair Management: One rail Right;Step to pattern;Forwards Number of Stairs: 4 General stair comments: VC's for sequencing and general safety. No assist required however hands on guarding provided throughout stair training.   Wheelchair Mobility    Modified Rankin (Stroke Patients Only)       Balance Overall balance assessment: Needs assistance Sitting-balance support: Single extremity supported Sitting balance-Leahy Scale: Good   Postural control: Posterior lean Standing balance support: During functional activity Standing balance-Leahy Scale: Fair Standing balance comment: no physical assist required                             Pertinent Vitals/Pain Pain Assessment: Faces Pain Score: 3  Faces Pain Scale: Hurts a little bit Pain Location: low back Pain Descriptors / Indicators: Operative site guarding Pain Intervention(s): Limited activity within patient's tolerance;Monitored during session;Repositioned    Home Living Family/patient expects to be discharged to:: Private residence Living Arrangements: Alone Available Help at Discharge: Family;Available 24 hours/day Type of Home: House Home Access: Stairs to enter   [employer] of Steps: 4 Home Layout: One level Home Equipment: Walker - 2 wheels;Cane - single point;Bedside  commode;Wheelchair - manual  Prior Function Level of Independence: Independent with assistive device(s)               Hand Dominance   Dominant Hand: Right    Extremity/Trunk Assessment   Upper Extremity Assessment Upper Extremity Assessment: Defer to OT evaluation    Lower Extremity Assessment Lower Extremity Assessment: Generalized weakness RLE Deficits / Details: R foot drop    Cervical / Trunk Assessment Cervical / Trunk Assessment: Other exceptions Cervical / Trunk Exceptions: s/p back sx  Communication   Communication: No difficulties  Cognition Arousal/Alertness: Awake/alert Behavior During Therapy: WFL for tasks assessed/performed Overall Cognitive Status: Within Functional Limits for tasks assessed                                        General Comments      Exercises     Assessment/Plan    PT Assessment Patient needs continued PT services  PT Problem List Decreased strength;Decreased activity tolerance;Decreased balance;Decreased mobility;Decreased knowledge of use of DME;Decreased safety awareness;Decreased knowledge of precautions;Pain       PT Treatment Interventions DME instruction;Gait training;Functional mobility training;Stair training;Therapeutic activities;Therapeutic exercise;Neuromuscular re-education;Patient/family education    PT Goals (Current goals can be found in the Care Plan section)  Acute Rehab PT Goals Patient Stated Goal: to go home PT Goal Formulation: With patient Time For Goal Achievement: 03/07/19 Potential to Achieve Goals: Good    Frequency Min 5X/week   Barriers to discharge        Co-evaluation               AM-PAC PT "6 Clicks" Mobility  Outcome Measure Help needed turning from your back to your side while in a flat bed without using bedrails?: None Help needed moving from lying on your back to sitting on the side of a flat bed without using bedrails?: A Little Help needed moving  to and from a bed to a chair (including a wheelchair)?: A Little Help needed standing up from a chair using your arms (e.g., wheelchair or bedside chair)?: A Little Help needed to walk in hospital room?: A Little Help needed climbing 3-5 steps with a railing? : A Little 6 Click Score: 19    End of Session Equipment Utilized During Treatment: Gait belt Activity Tolerance: Patient tolerated treatment well Patient left: in bed   PT Visit Diagnosis: Unsteadiness on feet (R26.81);Pain;Other symptoms and signs involving the nervous system (R29.898) Pain - part of body: (back)    Time: [id number] PT Time Calculation (min) (ACUTE ONLY): 23 min   Charges:   PT Evaluation $PT Eval Moderate Complexity: 1 Mod PT Treatments $Gait Training: 8-22 mins        Rolinda Roan, PT, DPT Acute Rehabilitation Services Pager: [phone] Office: [phone]   Thelma Comp 02/28/2019, 11:07 AM

## 2019-02-28 NOTE — Progress Notes (Signed)
Orthopedic Tech Progress Note Patient Details:  [name] May 24, 1960 [id number] RN said patient has brace Patient ID: Julia Stewart, female   DOB: [birth year], 59 y.o.   MRN: [id number]   Janit Pagan 02/28/2019, 7:49 AM

## 2019-02-28 NOTE — Plan of Care (Signed)
Patient alert and oriented, mae's well, voiding adequate amount of urine, swallowing without difficulty, no c/o pain at time of discharge. Patient discharged home with family. Script and discharged instructions given to patient. Patient and family stated understanding of instructions given. Patient has an appointment with Dr. Jones °

## 2019-02-28 NOTE — Discharge Summary (Signed)
Physician Discharge Summary  Patient ID: Julia Stewart MRN: [id number] DOB/AGE: 59-Aug-1962 59 y.o.  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 The patient tolerated the procedure well and was taken to the recovery room and then to the floor in stable condition. The hospital course was routine. There were no complications. The wound remained clean dry and intact. Pt had appropriate back soreness. No complaints of leg pain or new N/T/W. The patient remained afebrile with stable vital signs, and tolerated a regular diet. The patient continued to increase activities, and pain was well controlled with oral pain medications.   Consults: None  Significant Diagnostic Studies:  Results for orders placed or performed during the hospital encounter of 02/25/19  SARS CORONAVIRUS 2 (TAT 6-24 HRS) Nasopharyngeal Nasopharyngeal Swab   Specimen: Nasopharyngeal Swab  Result Value Ref Range   SARS Coronavirus 2 NEGATIVE NEGATIVE    Chest 2 View  Result Date: 02/25/2019 CLINICAL DATA:  Preoperative study. EXAM: CHEST - 2 VIEW COMPARISON:  None. FINDINGS: The heart size and mediastinal contours are within normal limits. Both lungs are clear. The visualized skeletal structures are unremarkable. IMPRESSION: No active cardiopulmonary disease. Electronically Signed   By: Dorise Bullion III M.D   On: 02/25/2019 17:32   Dg Lumbar Spine 2-3 Views  Result Date: 02/27/2019 CLINICAL DATA:  Portable imaging from L4-L5 posterior lumbar spine fusion. EXAM: LUMBAR SPINE - 2-3 VIEW; DG C-ARM 1-60 MIN COMPARISON:  01/29/2019 FINDINGS: Two submitted portable operative images show pedicle screws and interconnecting rods at L4  and L5. Pedicle screws appear well seated and well-positioned. They are are well-centered intervertebral cages at the L4-L5 disc interspace. IMPRESSION: Well-positioned orthopedic hardware for posterior L4-L5 fusion. Electronically Signed   By: Lajean Manes M.D.   On: 02/27/2019 19:36   Dg C-arm 1-60 Min  Result Date: 02/27/2019 CLINICAL DATA:  Portable imaging from L4-L5 posterior lumbar spine fusion. EXAM: LUMBAR SPINE - 2-3 VIEW; DG C-ARM 1-60 MIN COMPARISON:  01/29/2019 FINDINGS: Two submitted portable operative images show pedicle screws and interconnecting rods at L4 and L5. Pedicle screws appear well seated and well-positioned. They are are well-centered intervertebral cages at the L4-L5 disc interspace. IMPRESSION: Well-positioned orthopedic hardware for posterior L4-L5 fusion. Electronically Signed   By: Lajean Manes M.D.   On: 02/27/2019 19:36    Antibiotics:  Anti-infectives (From admission, onward)   Start     Dose/Rate Route Frequency Ordered Stop   02/28/19 0100  vancomycin (VANCOCIN) IVPB 1000 mg/200 mL premix     1,000 mg 200 mL/hr over 60 Minutes Intravenous  Once 02/27/19 1845 02/28/19 0035   02/27/19 2200  cefdinir (OMNICEF) capsule 300 mg     300 mg Oral Every 12 hours 02/27/19 1842     02/27/19 2200  doxycycline (VIBRA-TABS) tablet 100 mg     100 mg Oral 2 times daily 02/27/19 1842     02/27/19 1447  bacitracin 50,000 Units in sodium chloride 0.9 % 500 mL irrigation  Status:  Discontinued       As needed 02/27/19 1448 02/27/19 1741   02/27/19 1330  vancomycin (VANCOCIN) IVPB 1000 mg/200 mL premix     1,000 mg 200 mL/hr over 60 Minutes Intravenous On call to O.R. 02/27/19 1242  02/27/19 1415   02/27/19 1330  ceFAZolin (ANCEF) IVPB 2g/100 mL premix  Status:  Discontinued     2 g 200 mL/hr over 30 Minutes Intravenous On call to O.R. 02/27/19 1243 02/27/19 1840   02/27/19 1311  ceFAZolin (ANCEF) 2-4 GM/100ML-% IVPB    Note to Pharmacy: Therese Sarah   : cabinet  override      02/27/19 1311 02/28/19 0114      Discharge Exam: Blood pressure 124/70, pulse 67, temperature 98.3 F (36.8 C), temperature source Oral, resp. rate 18, height 5\' 7"  (1.702 m), weight 77 kg, SpO2 96 %. Neurologic: Grossly normal Dressing dry  Discharge Medications:   Allergies as of 02/28/2019      Reactions   Other Other (See Comments)   Severe Redness, "felt like my veins swelled up"   Alpha-gal    Penicillins Other (See Comments)   Did it involve swelling of the face/tongue/throat, SOB, or low BP? Unknown Did it involve sudden or severe rash/hives, skin peeling, or any reaction on the inside of your mouth or nose? Unknown Did you need to seek medical attention at a hospital or doctor's office? Unknown When did it last happen?childhood reaction  If all above answers are "NO", may proceed with cephalosporin use.      Medication List    STOP taking these medications   ibuprofen 200 MG tablet Commonly known as: ADVIL     TAKE these medications   ACAI BERRY PO Take 2 capsules by mouth daily in the afternoon. Acai Berry Cleanse   ACIDOPHILUS PO Take 2 capsules by mouth daily in the afternoon.   Biotin 10000 MCG Tabs Take 10,000 mcg by mouth daily in the afternoon.   cefUROXime 500 MG tablet Commonly known as: CEFTIN Take 500 mg by mouth 2 (two) times daily.   celecoxib 200 MG capsule Commonly known as: CELEBREX Take 1 capsule (200 mg total) by mouth every 12 (twelve) hours.   cholecalciferol 25 MCG (1000 UT) tablet Commonly known as: VITAMIN D Take 1,000 Units by mouth daily in the afternoon.   clobetasol ointment 0.05 % Commonly known as: TEMOVATE Apply 1 application topically 2 (two) times daily as needed.   COLLAGEN PO Take 2 tablets by mouth daily in the afternoon.   disulfiram 250 MG tablet Commonly known as: ANTABUSE Take 125 mg by mouth daily.   doxycycline 100 MG tablet Commonly known as: VIBRA-TABS Take 100 mg by mouth 2 (two)  times daily.   famotidine 40 MG tablet Commonly known as: PEPCID Take 40 mg by mouth 2 (two) times daily.   FLUoxetine 40 MG capsule Commonly known as: PROZAC Take 40 mg by mouth 2 (two) times daily.   Garlic [id number] MG Caps Take 1,000 mg by mouth daily in the afternoon.   levocetirizine 5 MG tablet Commonly known as: XYZAL Take 5 mg by mouth daily.   lisinopril 10 MG tablet Commonly known as: ZESTRIL Take 10 mg by mouth daily.   methocarbamol 500 MG tablet Commonly known as: ROBAXIN Take 1 tablet (500 mg total) by mouth every 6 (six) hours as needed for muscle spasms.   MSM 1500 MG Tabs Take 1,500 mg by mouth daily in the afternoon.   OVER THE COUNTER MEDICATION 2 tablets. Liver cleanse   oxyCODONE 5 MG immediate release tablet Commonly known as: Oxy IR/ROXICODONE Take 1 tablet (5 mg total) by mouth every 4 (four) hours as needed for moderate pain ((score 4 to 6)).   Systane 0.4-0.3 %  Soln Generic drug: Polyethyl Glycol-Propyl Glycol Place 1 drop into both eyes 3 (three) times daily as needed (allergy/itchy allergies.).   vitamin B-12 500 MCG tablet Commonly known as: CYANOCOBALAMIN Take 500 mcg by mouth daily in the afternoon.   vitamin B-12 1000 MCG tablet Commonly known as: CYANOCOBALAMIN Take 1,000 mcg by mouth daily.   vitamin C 1000 MG tablet Take 1,000 mg by mouth daily.   vitamin E 400 UNIT capsule Take 400 Units by mouth daily in the afternoon.            Durable Medical Equipment  (From admission, onward)         Start     Ordered   02/27/19 1843  DME Walker rolling  Once    Question:  Patient needs a walker to treat with the following condition  Answer:  S/P lumbar fusion   02/27/19 1842   02/27/19 1843  DME 3 n 1  Once     02/27/19 1842          Disposition: home   Final Dx: PLIF L4-5  Discharge Instructions     Remove dressing in 72 hours   Complete by: As directed    Call MD for:  difficulty breathing, headache or visual  disturbances   Complete by: As directed    Call MD for:  persistant nausea and vomiting   Complete by: As directed    Call MD for:  redness, tenderness, or signs of infection (pain, swelling, redness, odor or green/yellow discharge around incision site)   Complete by: As directed    Call MD for:  severe uncontrolled pain   Complete by: As directed    Call MD for:  temperature >100.4   Complete by: As directed    Diet - low sodium heart healthy   Complete by: As directed    Increase activity slowly   Complete by: As directed          Signed: Eustace Moore 02/28/2019, 8:10 AM

## 2019-02-28 NOTE — Evaluation (Signed)
Occupational Therapy Evaluation Patient Details Name: Julia Stewart MRN: [id number] DOB: 05/01/1961 Today's Date: 02/28/2019    History of Present Illness Pt is a 58yo female s/p Decompressive lumbar laminectomy L4-5 and PLIF L4-5. PMHx: anxiety, HTN, lumbar sx.   Clinical Impression   Pt PTA: living alone independently with mobility and ADL. Pt's mother lives next door and plans to stay with pt post discharge as needed. Pt currently performing LB ADL with figure four technique by crossing legs. Pt simulating shower tub transfer and has quite a high hip hike for transfer. Pt advised to have family nearby for tub transfers. Pt standing at sink for grooming and performing own toilet hygiene with verbal cue to avoid bending over. Back handout provided and reviewed ADL in detail. Pt able to state 3/3 precautions. Pt educated on: clothing between brace, never sleep in brace, set an alarm at night for medication, avoid sitting for long periods of time, correct bed positioning for sleeping, correct sequence for bed mobility, avoiding lifting more than 5 pounds and never wash directly over incision. All education is complete and patient indicates understanding. Pt does not require additional therapy for OT skilled services. OT signing off.      Follow Up Recommendations  No OT follow up;Supervision - Intermittent    Equipment Recommendations  None recommended by OT    Recommendations for Other Services       Precautions / Restrictions Precautions Precautions: Back Precaution Booklet Issued: Yes (comment) Precaution Comments: discussion with handout Required Braces or Orthoses: Spinal Brace Spinal Brace: Thoracolumbosacral orthotic Restrictions Weight Bearing Restrictions: No      Mobility Bed Mobility Overal bed mobility: Needs Assistance Bed Mobility: Sidelying to Sit;Rolling Rolling: Supervision Sidelying to sit: Supervision       General bed mobility comments: log  roll technique  Transfers Overall transfer level: Needs assistance Equipment used: None Transfers: Sit to/from Stand Sit to Stand: Supervision         General transfer comment: for safety as pt unsteady with initial standing balance    Balance Overall balance assessment: Needs assistance   Sitting balance-Leahy Scale: Good     Standing balance support: During functional activity Standing balance-Leahy Scale: Fair Standing balance comment: no physical assist required                           ADL either performed or assessed with clinical judgement   ADL Overall ADL's : Modified independent;At baseline                                       General ADL Comments: Pt performing LB ADL with figure four technique by crossing legs. Pt simulating shower tub transfer and has quite a high hip hike for transfer. Pt advised to have family nearby for transfers. Pt standing at sink for grooming and performing own toilet hygiene with verbal cue to avoid bending over.     Vision Baseline Vision/History: No visual deficits Vision Assessment?: No apparent visual deficits     Perception     Praxis      Pertinent Vitals/Pain Pain Assessment: 0-10 Pain Score: 3  Pain Location: low back Pain Descriptors / Indicators: Discomfort Pain Intervention(s): Monitored during session;Repositioned     Hand Dominance Right   Extremity/Trunk Assessment Upper Extremity Assessment Upper Extremity Assessment: Overall WFL for tasks assessed   Lower  Extremity Assessment Lower Extremity Assessment: Generalized weakness;RLE deficits/detail RLE Deficits / Details: R foot drop   Cervical / Trunk Assessment Cervical / Trunk Assessment: Other exceptions Cervical / Trunk Exceptions: s/p back sx   Communication Communication Communication: No difficulties   Cognition Arousal/Alertness: Awake/alert Behavior During Therapy: WFL for tasks assessed/performed Overall  Cognitive Status: Within Functional Limits for tasks assessed                                     General Comments       Exercises     Shoulder Instructions      Home Living Family/patient expects to be discharged to:: Private residence Living Arrangements: Alone Available Help at Discharge: Family;Available 24 hours/day Type of Home: House Home Access: Stairs to enter [employer] of Steps: 4   Home Layout: One level     Bathroom Shower/Tub: Teacher, early years/pre: Handicapped height     Home Equipment: Environmental consultant - 2 wheels;Cane - single point;Bedside commode;Wheelchair - manual          Prior Functioning/Environment Level of Independence: Independent with assistive device(s)                 OT Problem List: Impaired balance (sitting and/or standing);Decreased safety awareness      OT Treatment/Interventions:      OT Goals(Current goals can be found in the care plan section) Acute Rehab OT Goals Patient Stated Goal: to go home OT Goal Formulation: With patient  OT Frequency:     Barriers to D/C:            Co-evaluation              AM-PAC OT "6 Clicks" Daily Activity     Outcome Measure Help from another person eating meals?: None Help from another person taking care of personal grooming?: None Help from another person toileting, which includes using toliet, bedpan, or urinal?: None Help from another person bathing (including washing, rinsing, drying)?: A Little Help from another person to put on and taking off regular upper body clothing?: None Help from another person to put on and taking off regular lower body clothing?: None 6 Click Score: 23   End of Session Equipment Utilized During Treatment: Back brace Nurse Communication: Mobility status  Activity Tolerance: Patient tolerated treatment well Patient left: in chair;with call bell/phone within reach  OT Visit Diagnosis: Unsteadiness on feet  (R26.81);Muscle weakness (generalized) (M62.81)                Time: [id number] OT Time Calculation (min): 31 min Charges:  OT General Charges $OT Visit: 1 Visit OT Evaluation $OT Eval Moderate Complexity: 1 Mod OT Treatments $Self Care/Home Management : 8-22 mins  Ebony Hail Harold Hedge) Marsa Aris OTR/L Acute Rehabilitation Services Pager: [phone] Office: [place] 02/28/2019, 8:41 AM

## 2019-02-28 NOTE — Anesthesia Postprocedure Evaluation (Signed)
Anesthesia Post Note  Patient: Julia Stewart  Procedure(s) Performed: Posterior Lumbar Interbody Fusion - Lumbar Four-Lumbar Five (N/A Back)     Patient location during evaluation: PACU Anesthesia Type: General Level of consciousness: awake and alert Pain management: pain level controlled Vital Signs Assessment: post-procedure vital signs reviewed and stable Respiratory status: spontaneous breathing, nonlabored ventilation, respiratory function stable and patient connected to nasal cannula oxygen Cardiovascular status: blood pressure returned to baseline and stable Postop Assessment: no apparent nausea or vomiting Anesthetic complications: no    Last Vitals:  Vitals:   02/28/19 0803 02/28/19 1208  BP: 124/70 (!) 103/55  Pulse: 67 61  Resp:  19  Temp: 36.8 C 36.8 C  SpO2: 96% 98%    Last Pain:  Vitals:   02/28/19 1349  TempSrc:   PainSc: 3                  Catalina Gravel

## 2019-10-10 ENCOUNTER — Other Ambulatory Visit: Payer: Self-pay | Admitting: *Deleted

## 2019-10-10 DIAGNOSIS — I739 Peripheral vascular disease, unspecified: Secondary | ICD-10-CM

## 2019-10-11 ENCOUNTER — Telehealth (HOSPITAL_COMMUNITY): Payer: Self-pay

## 2019-10-11 NOTE — Telephone Encounter (Signed)

## 2019-10-15 ENCOUNTER — Encounter: Payer: Self-pay | Admitting: Vascular Surgery

## 2019-10-15 ENCOUNTER — Other Ambulatory Visit: Payer: Self-pay

## 2019-10-15 ENCOUNTER — Ambulatory Visit: Payer: Medicare PPO | Admitting: Vascular Surgery

## 2019-10-15 ENCOUNTER — Ambulatory Visit (HOSPITAL_COMMUNITY)
Admission: RE | Admit: 2019-10-15 | Discharge: 2019-10-15 | Disposition: A | Discharge status: Home / Self Care | Payer: Medicare PPO | Source: Ambulatory Visit | Attending: Vascular Surgery | Admitting: Vascular Surgery

## 2019-10-15 DIAGNOSIS — M79671 Pain in right foot: Secondary | ICD-10-CM

## 2019-10-15 DIAGNOSIS — I739 Peripheral vascular disease, unspecified: Secondary | ICD-10-CM

## 2019-10-15 DIAGNOSIS — M79673 Pain in unspecified foot: Secondary | ICD-10-CM | POA: Insufficient documentation
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
## 2019-10-16 ENCOUNTER — Other Ambulatory Visit: Payer: Self-pay

## 2019-10-21 ENCOUNTER — Other Ambulatory Visit (HOSPITAL_COMMUNITY): Payer: Medicare PPO

## 2019-10-21 ENCOUNTER — Telehealth: Payer: Self-pay

## 2019-10-21 NOTE — Telephone Encounter (Signed)
Pt called to r/s her AGM scheduled this week. She does not have transportation and does not want [place] to help her with transportation. She would like to r/s for the following week. She has been r/s for 10/31/19. Covid screening appt moved as well and provided pre op instructions. Pt verbalized understanding.

## 2019-10-28 ENCOUNTER — Other Ambulatory Visit (HOSPITAL_COMMUNITY)
Admission: RE | Admit: 2019-10-28 | Discharge: 2019-10-28 | Disposition: A | Discharge status: Home / Self Care | Payer: Medicare PPO | Source: Ambulatory Visit | Attending: Vascular Surgery | Admitting: Vascular Surgery

## 2019-10-28 DIAGNOSIS — Z20822 Contact with and (suspected) exposure to covid-19: Secondary | ICD-10-CM | POA: Diagnosis not present

## 2019-10-28 DIAGNOSIS — Z01812 Encounter for preprocedural laboratory examination: Secondary | ICD-10-CM | POA: Insufficient documentation

## 2019-10-28 LAB — SARS CORONAVIRUS 2 (TAT 6-24 HRS): SARS Coronavirus 2: NEGATIVE

## 2019-10-31 ENCOUNTER — Encounter (HOSPITAL_COMMUNITY)
Admission: RE | Disposition: A | Discharge status: Home / Self Care | Payer: Self-pay | Source: Home / Self Care | Attending: Vascular Surgery

## 2019-10-31 ENCOUNTER — Ambulatory Visit (HOSPITAL_COMMUNITY)
Admission: RE | Admit: 2019-10-31 | Discharge: 2019-10-31 | Disposition: A | Discharge status: Home / Self Care | Payer: Medicare PPO | Attending: Vascular Surgery | Admitting: Vascular Surgery

## 2019-10-31 ENCOUNTER — Other Ambulatory Visit: Payer: Self-pay

## 2019-10-31 DIAGNOSIS — A6920 Lyme disease, unspecified: Secondary | ICD-10-CM | POA: Insufficient documentation

## 2019-10-31 DIAGNOSIS — Z79899 Other long term (current) drug therapy: Secondary | ICD-10-CM | POA: Insufficient documentation

## 2019-10-31 DIAGNOSIS — I10 Essential (primary) hypertension: Secondary | ICD-10-CM | POA: Insufficient documentation

## 2019-10-31 DIAGNOSIS — M21371 Foot drop, right foot: Secondary | ICD-10-CM | POA: Insufficient documentation

## 2019-10-31 DIAGNOSIS — F17290 Nicotine dependence, other tobacco product, uncomplicated: Secondary | ICD-10-CM | POA: Insufficient documentation

## 2019-10-31 DIAGNOSIS — I739 Peripheral vascular disease, unspecified: Secondary | ICD-10-CM | POA: Diagnosis present

## 2019-10-31 DIAGNOSIS — K219 Gastro-esophageal reflux disease without esophagitis: Secondary | ICD-10-CM | POA: Insufficient documentation

## 2019-10-31 DIAGNOSIS — F419 Anxiety disorder, unspecified: Secondary | ICD-10-CM | POA: Insufficient documentation

## 2019-10-31 DIAGNOSIS — F329 Major depressive disorder, single episode, unspecified: Secondary | ICD-10-CM | POA: Insufficient documentation

## 2019-10-31 DIAGNOSIS — Z880 Allergy status to penicillin: Secondary | ICD-10-CM | POA: Diagnosis not present

## 2019-10-31 HISTORY — PX: ABDOMINAL AORTOGRAM W/LOWER EXTREMITY: CATH118223

## 2019-10-31 LAB — POCT I-STAT, CHEM 8
BUN: 24 mg/dL — ABNORMAL HIGH (ref 6–20)
Calcium, Ion: 1.29 mmol/L (ref 1.15–1.40)
Chloride: 105 mmol/L (ref 98–111)
Creatinine, Ser: 1.2 mg/dL — ABNORMAL HIGH (ref 0.44–1.00)
Glucose, Bld: 101 mg/dL — ABNORMAL HIGH (ref 70–99)
HCT: 37 % (ref 36.0–46.0)
Hemoglobin: 12.6 g/dL (ref 12.0–15.0)
Potassium: 4.3 mmol/L (ref 3.5–5.1)
Sodium: 140 mmol/L (ref 135–145)
TCO2: 20 mmol/L — ABNORMAL LOW (ref 22–32)

## 2019-10-31 SURGERY — ABDOMINAL AORTOGRAM W/LOWER EXTREMITY
Anesthesia: LOCAL

## 2019-10-31 MED ORDER — SODIUM CHLORIDE 0.9 % IV SOLN: 250.0000 mL | INTRAVENOUS | Status: DC | PRN

## 2019-10-31 MED ORDER — HYDRALAZINE HCL 20 MG/ML IJ SOLN: 5.0000 mg | INTRAMUSCULAR | Status: DC | PRN

## 2019-10-31 MED ORDER — HEPARIN (PORCINE) IN NACL 1000-0.9 UT/500ML-% IV SOLN
INTRAVENOUS | Status: DC | PRN
  Administered 2019-10-31 (×2): 500 mL

## 2019-10-31 MED ORDER — SODIUM CHLORIDE 0.9 % WEIGHT BASED INFUSION: 1.0000 mL/kg/h | INTRAVENOUS | Status: DC

## 2019-10-31 MED ORDER — ACETAMINOPHEN 325 MG PO TABS: 650.0000 mg | ORAL_TABLET | ORAL | Status: DC | PRN

## 2019-10-31 MED ORDER — FENTANYL CITRATE (PF) 100 MCG/2ML IJ SOLN
INTRAMUSCULAR | Status: DC | PRN
  Administered 2019-10-31: 50 ug via INTRAVENOUS

## 2019-10-31 MED ORDER — LIDOCAINE HCL (PF) 1 % IJ SOLN
INTRAMUSCULAR | Status: AC
  Filled 2019-10-31: qty 30

## 2019-10-31 MED ORDER — HYDRALAZINE HCL 20 MG/ML IJ SOLN
INTRAMUSCULAR | Status: AC
  Filled 2019-10-31: qty 1

## 2019-10-31 MED ORDER — MIDAZOLAM HCL 2 MG/2ML IJ SOLN
INTRAMUSCULAR | Status: AC
  Filled 2019-10-31: qty 2

## 2019-10-31 MED ORDER — SODIUM CHLORIDE 0.9% FLUSH: 3.0000 mL | Freq: Two times a day (BID) | INTRAVENOUS | Status: DC

## 2019-10-31 MED ORDER — FENTANYL CITRATE (PF) 100 MCG/2ML IJ SOLN
INTRAMUSCULAR | Status: AC
  Filled 2019-10-31: qty 2

## 2019-10-31 MED ORDER — LABETALOL HCL 5 MG/ML IV SOLN: 10.0000 mg | INTRAVENOUS | Status: DC | PRN

## 2019-10-31 MED ORDER — MIDAZOLAM HCL 2 MG/2ML IJ SOLN
INTRAMUSCULAR | Status: DC | PRN
  Administered 2019-10-31: 1 mg via INTRAVENOUS

## 2019-10-31 MED ORDER — SODIUM CHLORIDE 0.9% FLUSH: 3.0000 mL | INTRAVENOUS | Status: DC | PRN

## 2019-10-31 MED ORDER — SODIUM CHLORIDE 0.9 % IV SOLN: INTRAVENOUS | Status: DC

## 2019-10-31 MED ORDER — ONDANSETRON HCL 4 MG/2ML IJ SOLN: 4.0000 mg | Freq: Four times a day (QID) | INTRAMUSCULAR | Status: DC | PRN

## 2019-10-31 MED ORDER — IODIXANOL 320 MG/ML IV SOLN
INTRAVENOUS | Status: DC | PRN
  Administered 2019-10-31: 175 mL via INTRA_ARTERIAL

## 2019-10-31 MED ORDER — HYDRALAZINE HCL 20 MG/ML IJ SOLN
INTRAMUSCULAR | Status: DC | PRN
  Administered 2019-10-31 (×2): 10 mg via INTRAVENOUS

## 2019-10-31 MED ORDER — HEPARIN (PORCINE) IN NACL 1000-0.9 UT/500ML-% IV SOLN
INTRAVENOUS | Status: AC
  Filled 2019-10-31: qty 1000

## 2019-10-31 MED ORDER — LIDOCAINE HCL (PF) 1 % IJ SOLN
INTRAMUSCULAR | Status: DC | PRN
  Administered 2019-10-31: 18 mL via INTRADERMAL

## 2019-10-31 SURGICAL SUPPLY — 10 items
CATH OMNI FLUSH 5F 65CM (CATHETERS) ×2 IMPLANT
KIT MICROPUNCTURE NIT STIFF (SHEATH) ×2 IMPLANT
KIT PV (KITS) ×2 IMPLANT
SHEATH PINNACLE 5F 10CM (SHEATH) ×2 IMPLANT
SHEATH PROBE COVER 6X72 (BAG) ×2 IMPLANT
SYR MEDRAD MARK V 150ML (SYRINGE) ×2 IMPLANT
TRANSDUCER W/STOPCOCK (MISCELLANEOUS) ×2 IMPLANT
TRAY PV CATH (CUSTOM PROCEDURE TRAY) ×2 IMPLANT
WIRE EMERALD ST .035X150CM (WIRE) ×2 IMPLANT
WIRE J 3MM .035X145CM (WIRE) ×2 IMPLANT

## 2019-10-31 NOTE — Op Note (Signed)
Patient name: Julia Stewart MRN: [id number] DOB: 03-Oct-1960 Sex: female  10/31/2019 Pre-operative Diagnosis: Chronic right foot drop with leg pain and delayed capillary refill and absent palpable pedal pulses Post-operative diagnosis:  Same Surgeon:  Marty Heck, MD Procedure Performed: 1.  Ultrasound-guided access of left common femoral artery 2.  Aortogram including catheter selection of the aorta 3.  Bilateral lower extremity arteriogram with runoff 4.  45 minutes of monitored moderate conscious sedation time  Indications: Patient is a 59 year old female who has a chronic right foot drop in the right lower extremity and has undergone spine surgery with an L4-L5 fusion with Dr. Ronnald Ramp.  Ultimately on further follow-up she continues to have pain in her right leg with delayed capillary refill and no palpable pedal pulses.  She presents today for planned arteriogram and possible intervention to rule out underlying atherosclerotic disease after risks and benefits discussed.  Findings:   Aortogram showed patent renal arteries bilaterally.  She does have a small infrarenal aorta that has no flow-limiting stenosis.  There is a slightly ectatic segment just above the bifurcation.  Both iliacs are widely patent.  Right lower extremity runoff shows a patent common femoral, profunda, SFA, above and below-knee popliteal artery and a patent trifurcation.  Her tibial vessels are very small and on lateral foot shot appears to occlude her anterior tibial and peroneal in the distal calf and only has posterior tibial runoff into the foot with filling of plantar arch and no evidence of a dorsalis pedis.   Left lower extremity arteriogram shows a patent common femoral, profunda, SFA, above and below-knee popliteal artery and a patent trifurcation.  Again the contrast appears to washout above the ankle.   Procedure:  The patient was identified in the holding area and taken to room 8.  The  patient was then placed supine on the table and prepped and draped in the usual sterile fashion.  A time out was called.  Ultrasound was used to evaluate the left common femoral artery.  It was patent .  A digital ultrasound image was acquired.  A micropuncture needle was used to access the left common femoral artery under ultrasound guidance.  An 018 wire was advanced without resistance and a micropuncture sheath was placed.  The 018 wire was removed and a benson wire was placed.  The micropuncture sheath was exchanged for a 5 french sheath.  An omniflush catheter was advanced over the wire to the level of L-1.  An abdominal angiogram was obtained.  Next the Omni Flush catheter was pulled down and bilateral extremity runoff was obtained.  Given the focus of interest was on the right leg and the contrast appeared to washout in the distal calf we used a straight wire and selected the right iliac and placed our Omni Flush catheter down into the distal right external iliac.  A lateral right foot shot was then obtained with delayed imaging.  Pertinent findings are noted above.  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  That point in time wires and catheters were removed.  She remained stable throughout the case.  She was taken to holding to have here sheath removed from her left groin.  Plan: Patient has no large vessel occlusive disease amenable to intervention.  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  Will need medical management with aspirin  statin and avoidance of tobacco.    Marty Heck, MD [facility] Office: [phone]

## 2019-10-31 NOTE — Progress Notes (Signed)
Up and walked and tolerated well; left groin stable, no bleeding or hematoma 

## 2019-10-31 NOTE — Progress Notes (Addendum)
Site area: Left groin a 5 french arterial sheath was removed by Tillman Sers RN  Site Prior to Removal:  Level 0  Pressure Applied For 20 MINUTES    Bedrest Beginning at 0920am  Manual:   Yes.    Patient Status During Pull:  stable  Post Pull Groin Site:  Level 0  Post Pull Instructions Given:  Yes.    Post Pull Pulses Present:  Yes.    Dressing Applied:  Yes.    Comments:
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
## 2019-10-31 NOTE — Discharge Instructions (Signed)
Femoral Site Care This sheet gives you information about how to care for yourself after your procedure. Your health care provider may also give you more specific instructions. If you have problems or questions, contact your health care provider. What can I expect after the procedure? After the procedure, it is common to have:  Bruising that usually fades within 1-2 weeks.  Tenderness at the site. Follow these instructions at home: Wound care  Follow instructions from your health care provider about how to take care of your insertion site. Make sure you: ? Wash your hands with soap and water before you change your bandage (dressing). If soap and water are not available, use hand sanitizer. ? Change your dressing as told by your health care provider. ? Leave stitches (sutures), skin glue, or adhesive strips in place. These skin closures may need to stay in place for 2 weeks or longer. If adhesive strip edges start to loosen and curl up, you may trim the loose edges. Do not remove adhesive strips completely unless your health care provider tells you to do that.  Do not take baths, swim, or use a hot tub until your health care provider approves.  You may shower 24-48 hours after the procedure or as told by your health care provider. ? Gently wash the site with plain soap and water. ? Pat the area dry with a clean towel. ? Do not rub the site. This may cause bleeding.  Do not apply powder or lotion to the site. Keep the site clean and dry.  Check your femoral site every day for signs of infection. Check for: ? Redness, swelling, or pain. ? Fluid or blood. ? Warmth. ? Pus or a bad smell. Activity  For the first 2-3 days after your procedure, or as long as directed: ? Avoid climbing stairs as much as possible. ? Do not squat.  Do not lift anything that is heavier than 10 lb (4.5 kg), or the limit that you are told, until your health care provider says that it is safe.  Rest as  directed. ? Avoid sitting for a long time without moving. Get up to take short walks every 1-2 hours.  Do not drive for 24 hours if you were given a medicine to help you relax (sedative). General instructions  Take over-the-counter and prescription medicines only as told by your health care provider.  Keep all follow-up visits as told by your health care provider. This is important. Contact a health care provider if you have:  A fever or chills.  You have redness, swelling, or pain around your insertion site. Get help right away if:  The catheter insertion area swells very fast.  You pass out.  You suddenly start to sweat or your skin gets clammy.  The catheter insertion area is bleeding, and the bleeding does not stop when you hold steady pressure on the area.  The area near or just beyond the catheter insertion site becomes pale, cool, tingly, or numb. These symptoms may represent a serious problem that is an emergency. Do not wait to see if the symptoms will go away. Get medical help right away. Call your local emergency services (911 in the [place] Do not drive yourself to the hospital. Summary  After the procedure, it is common to have bruising that usually fades within 1-2 weeks.  Check your femoral site every day for signs of infection.  Do not lift anything that is heavier than 10 lb (4.5 kg), or the   limit that you are told, until your health care provider says that it is safe. This information is not intended to replace advice given to you by your health care provider. Make sure you discuss any questions you have with your health care provider. Document Revised: 05/15/2017 Document Reviewed: 05/15/2017 Elsevier Patient Education  [place].    No large vessel disease amendable to intervention in the right leg.  Would recommend medical management with aspirin statin and avoidance of tobacco.  Please call vascular & vein specialist if worsening symptoms.

## 2019-11-01 ENCOUNTER — Encounter (HOSPITAL_COMMUNITY): Payer: Self-pay | Admitting: Vascular Surgery

## 2019-11-10 IMAGING — RF DG C-ARM 1-60 MIN
1 series · 2 of 2 positions shown · non-contrast
Comparison: 01/29/2019

CLINICAL DATA: Portable imaging from L4-L5 posterior lumbar spine
fusion.

EXAM:
LUMBAR SPINE - 2-3 VIEW; DG C-ARM 1-60 MIN

[Series 1: run · 2 of 2 slices shown]
[im 1/2]
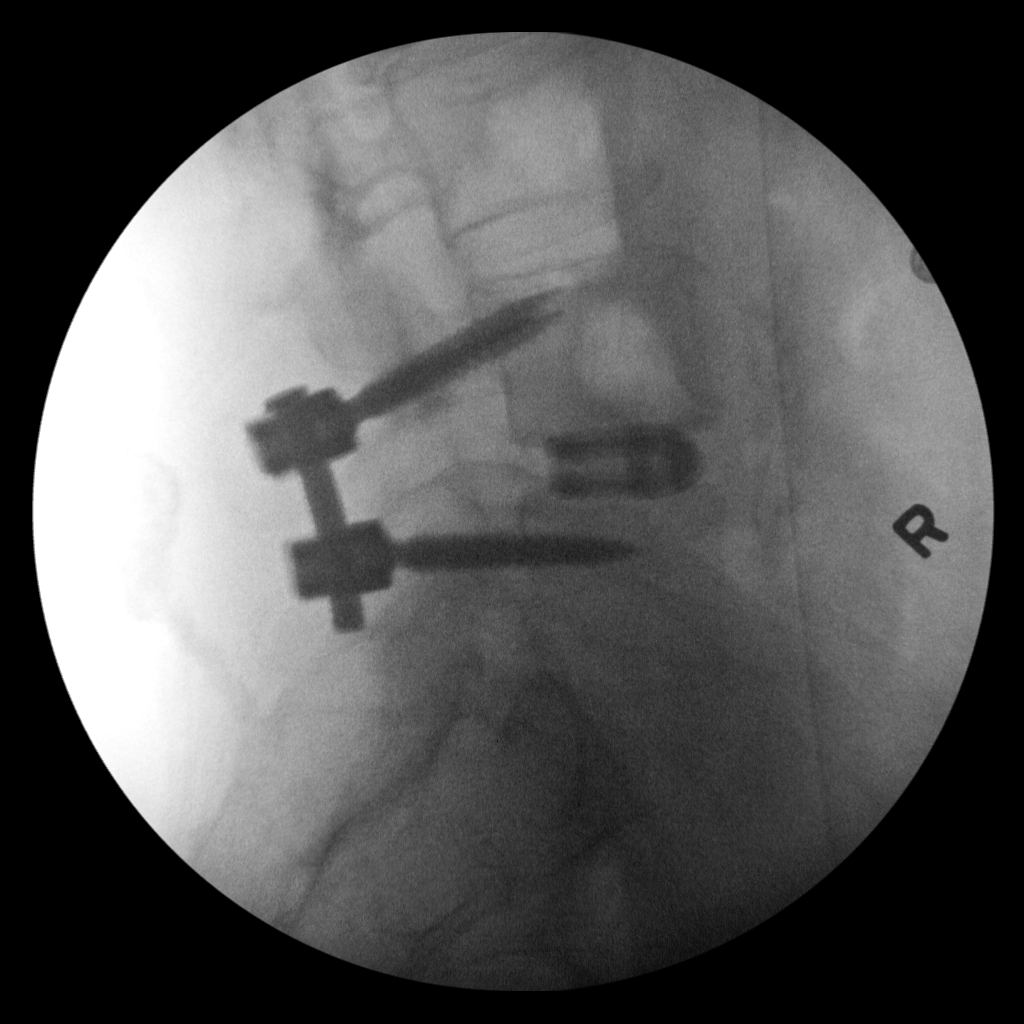
[im 2/2]
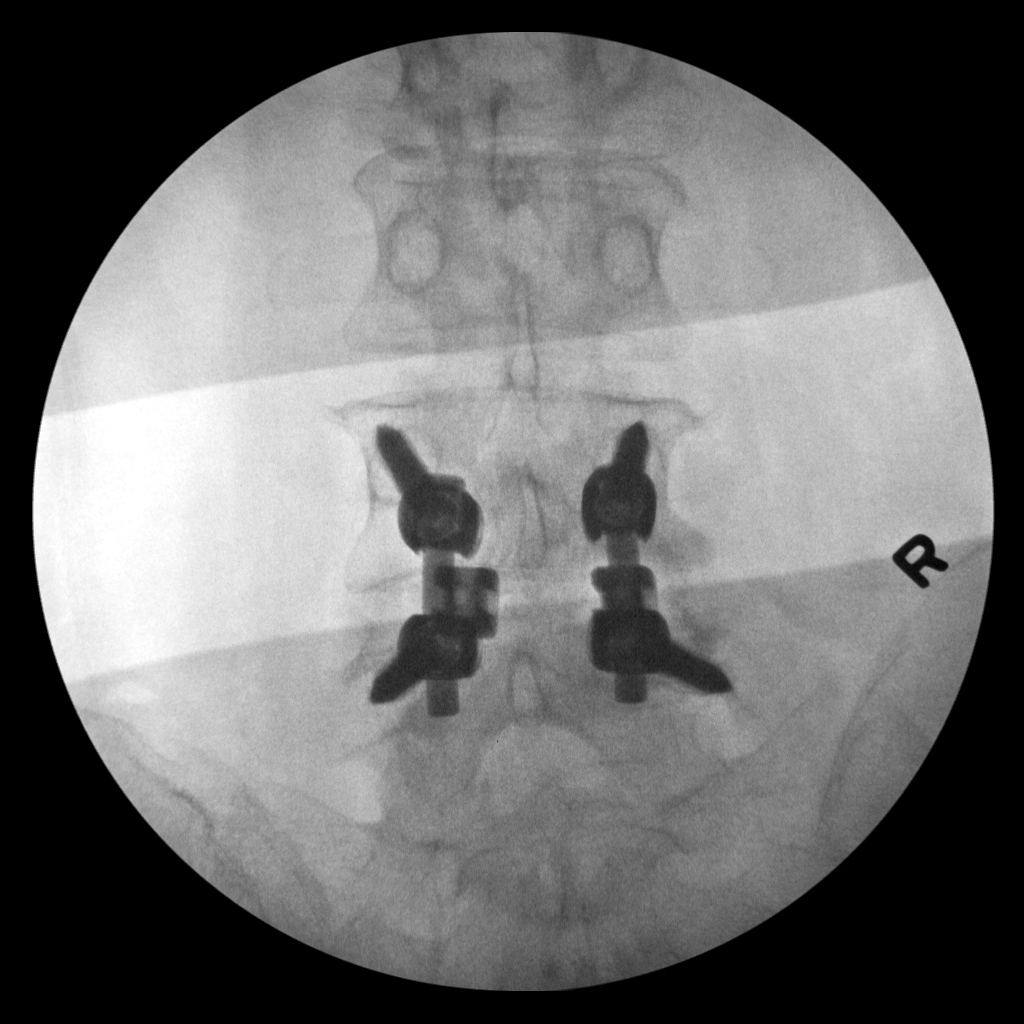

[2 of 2 positions shown; findings below may reference images not displayed]

FINDINGS: Two submitted portable operative images show pedicle screws and
interconnecting rods at L4 and L5. Pedicle screws appear well seated
and well-positioned. They are are well-centered intervertebral cages
at the L4-L5 disc interspace.
IMPRESSION: Well-positioned orthopedic hardware for posterior L4-L5 fusion.

## 2020-04-28 ENCOUNTER — Ambulatory Visit: Payer: Self-pay | Admitting: Student

## 2020-05-01 NOTE — Progress Notes (Signed)
DUE TO COVID-19 ONLY ONE VISITOR IS ALLOWED TO COME WITH YOU AND STAY IN THE WAITING ROOM ONLY DURING PRE OP AND PROCEDURE DAY OF SURGERY. THE 1 VISITOR  MAY VISIT WITH YOU AFTER SURGERY IN YOUR PRIVATE ROOM DURING VISITING HOURS ONLY!  YOU NEED TO HAVE A COVID 19 TEST ON___12/27/2021 ____ @_______ , THIS TEST MUST BE DONE BEFORE SURGERY,  COVID TESTING SITE [address] [place] [address], IT IS ON THE RIGHT GOING OUT [address] APPROXIMATELY  2 MINUTES PAST [employer] ON THE RIGHT. ONCE YOUR COVID TEST IS COMPLETED,  PLEASE BEGIN THE QUARANTINE INSTRUCTIONS AS OUTLINED IN YOUR HANDOUT.                Julia Stewart  05/01/2020   Your procedure is scheduled on:  05/13/2020   Report to [facility] Main  Entrance   Report to admitting at    [place] AM     Call this number if you have problems the morning of surgery [phone]    REMEMBER: NO  SOLID FOOD CANDY OR GUM AFTER MIDNIGHT. CLEAR LIQUIDS UNTIL    0715am        . NOTHING BY MOUTH EXCEPT CLEAR LIQUIDS UNTIL    . PLEASE FINISH ENSURE DRINK PER SURGEON ORDER  WHICH NEEDS TO BE COMPLETED AT   0715am    .      CLEAR LIQUID DIET   Foods Allowed                                                                    Coffee and tea, regular and decaf                            Fruit ices (not with fruit pulp)                                      Iced Popsicles                                    Carbonated beverages, regular and diet                                    Cranberry, grape and apple juices Sports drinks like Gatorade Lightly seasoned clear broth or consume(fat free) Sugar, honey syrup ___________________________________________________________________      BRUSH YOUR TEETH MORNING OF SURGERY AND RINSE YOUR MOUTH OUT, NO CHEWING GUM CANDY OR MINTS.     Take these medicines the morning of surgery with A SIP OF WATER: xyzal, prozac, pepcid, inhalers as usual and bring, buspar   DO  NOT TAKE ANY DIABETIC MEDICATIONS DAY OF YOUR SURGERY                               You may not have any metal on your body including hair pins and  piercings  Do not wear jewelry, make-up, lotions, powders or perfumes, deodorant             Do not wear nail polish on your fingernails.  Do not shave  48 hours prior to surgery.              Men may shave face and neck.   Do not bring valuables to the hospital. [place].  Contacts, dentures or bridgework may not be worn into surgery.  Leave suitcase in the car. After surgery it may be brought to your room.     Patients discharged the day of surgery will not be allowed to drive home. IF YOU ARE HAVING SURGERY AND GOING HOME THE SAME DAY, YOU MUST HAVE AN ADULT TO DRIVE YOU HOME AND BE WITH YOU FOR 24 HOURS. YOU MAY GO HOME BY TAXI OR UBER OR ORTHERWISE, BUT AN ADULT MUST ACCOMPANY YOU HOME AND STAY WITH YOU FOR 24 HOURS.  Name and phone number of your driver:  Special Instructions: N/A              Please read over the following fact sheets you were given: _____________________________________________________________________  [facility] - Preparing for Surgery Before surgery, you can play an important role.  Because skin is not sterile, your skin needs to be as free of germs as possible.  You can reduce the number of germs on your skin by washing with CHG (chlorahexidine gluconate) soap before surgery.  CHG is an antiseptic cleaner which kills germs and bonds with the skin to continue killing germs even after washing. Please DO NOT use if you have an allergy to CHG or antibacterial soaps.  If your skin becomes reddened/irritated stop using the CHG and inform your nurse when you arrive at Short Stay. Do not shave (including legs and underarms) for at least 48 hours prior to the first CHG shower.  You may shave your face/neck. Please follow these instructions carefully:  1.  Shower  with CHG Soap the night before surgery and the  morning of Surgery.  2.  If you choose to wash your hair, wash your hair first as usual with your  normal  shampoo.  3.  After you shampoo, rinse your hair and body thoroughly to remove the  shampoo.                           4.  Use CHG as you would any other liquid soap.  You can apply chg directly  to the skin and wash                       Gently with a scrungie or clean washcloth.  5.  Apply the CHG Soap to your body ONLY FROM THE NECK DOWN.   Do not use on face/ open                           Wound or open sores. Avoid contact with eyes, ears mouth and genitals (private parts).                       Wash face,  Genitals (private parts) with your normal soap.             6.  Wash thoroughly, paying special attention to the area where your surgery  will be performed.  7.  Thoroughly rinse your body with warm water from the neck down.  8.  DO NOT shower/wash with your normal soap after using and rinsing off  the CHG Soap.                9.  Pat yourself dry with a clean towel.            10.  Wear clean pajamas.            11.  Place clean sheets on your bed the night of your first shower and do not  sleep with pets. Day of Surgery : Do not apply any lotions/deodorants the morning of surgery.  Please wear clean clothes to the hospital/surgery center.  FAILURE TO FOLLOW THESE INSTRUCTIONS MAY RESULT IN THE CANCELLATION OF YOUR SURGERY PATIENT SIGNATURE_________________________________  NURSE SIGNATURE__________________________________  ________________________________________________________________________

## 2020-05-05 ENCOUNTER — Encounter (HOSPITAL_COMMUNITY)
Admission: RE | Admit: 2020-05-05 | Discharge: 2020-05-05 | Disposition: A | Discharge status: Home / Self Care | Payer: Medicare PPO | Source: Ambulatory Visit | Attending: Orthopedic Surgery | Admitting: Orthopedic Surgery

## 2020-05-05 ENCOUNTER — Encounter (HOSPITAL_COMMUNITY): Payer: Self-pay

## 2020-05-05 ENCOUNTER — Other Ambulatory Visit: Payer: Self-pay

## 2020-05-05 DIAGNOSIS — Z01818 Encounter for other preprocedural examination: Secondary | ICD-10-CM | POA: Diagnosis not present

## 2020-05-05 LAB — PROTIME-INR
INR: 1 (ref 0.8–1.2)
Prothrombin Time: 12.9 seconds (ref 11.4–15.2)

## 2020-05-05 LAB — URINALYSIS, ROUTINE W REFLEX MICROSCOPIC
Bilirubin Urine: NEGATIVE
Glucose, UA: NEGATIVE mg/dL
Hgb urine dipstick: NEGATIVE
Ketones, ur: NEGATIVE mg/dL
Nitrite: NEGATIVE
Protein, ur: NEGATIVE mg/dL
Specific Gravity, Urine: 1.018 (ref 1.005–1.030)
pH: 5 (ref 5.0–8.0)

## 2020-05-05 LAB — CBC
HCT: 38 % (ref 36.0–46.0)
Hemoglobin: 12 g/dL (ref 12.0–15.0)
MCH: 29.9 pg (ref 26.0–34.0)
MCHC: 31.6 g/dL (ref 30.0–36.0)
MCV: 94.8 fL (ref 80.0–100.0)
Platelets: 257 10*3/uL (ref 150–400)
RBC: 4.01 MIL/uL (ref 3.87–5.11)
RDW: 14.4 % (ref 11.5–15.5)
WBC: 8.7 10*3/uL (ref 4.0–10.5)
nRBC: 0 % (ref 0.0–0.2)

## 2020-05-05 LAB — COMPREHENSIVE METABOLIC PANEL
ALT: 17 U/L (ref 0–44)
AST: 21 U/L (ref 15–41)
Albumin: 3.8 g/dL (ref 3.5–5.0)
Alkaline Phosphatase: 108 U/L (ref 38–126)
Anion gap: 9 (ref 5–15)
BUN: 21 mg/dL — ABNORMAL HIGH (ref 6–20)
CO2: 23 mmol/L (ref 22–32)
Calcium: 9 mg/dL (ref 8.9–10.3)
Chloride: 108 mmol/L (ref 98–111)
Creatinine, Ser: 0.94 mg/dL (ref 0.44–1.00)
GFR, Estimated: >60 mL/min (ref >60)
Glucose, Bld: 98 mg/dL (ref 70–99)
Potassium: 4.3 mmol/L (ref 3.5–5.1)
Sodium: 140 mmol/L (ref 135–145)
Total Bilirubin: 0.5 mg/dL (ref 0.3–1.2)
Total Protein: 7.4 g/dL (ref 6.5–8.1)

## 2020-05-05 LAB — SURGICAL PCR SCREEN
MRSA, PCR: NEGATIVE
Staphylococcus aureus: NEGATIVE

## 2020-05-05 NOTE — Progress Notes (Addendum)
Anesthesia Review:  PCP: [facility] [phone] Awaiting official clearance that was sent to ortho have requested Have requested LOV and EKG done for clearance on 04/23/2020 - on chart  Cardiologist : Chest x-ray : EKG :04/23/2020 on charstr  Echo : Stress test: Cardiac Cath :  Activity level: cannot do a flight of stairs without difficulty  Sleep Study/ CPAP :no  Fasting Blood Sugar :      / Checks Blood Sugar -- times a day:   Blood Thinner/ Instructions /Last Dose: ASA / Instructions/ Last Dose :  81 mg Aspirin  U/A routed to DR [employer] .

## 2020-05-11 ENCOUNTER — Other Ambulatory Visit (HOSPITAL_COMMUNITY): Payer: Medicare PPO

## 2020-05-11 ENCOUNTER — Ambulatory Visit: Payer: Self-pay | Admitting: Student
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
## 2020-05-13 LAB — TYPE AND SCREEN
ABO/RH(D): A POS
Antibody Screen: NEGATIVE

## 2020-05-21 ENCOUNTER — Other Ambulatory Visit: Payer: Self-pay

## 2020-05-21 ENCOUNTER — Inpatient Hospital Stay (HOSPITAL_COMMUNITY): Admission: RE | Admit: 2020-05-21 | Payer: Medicare PPO | Source: Ambulatory Visit

## 2020-05-26 NOTE — Anesthesia Preprocedure Evaluation (Signed)
Anesthesia Evaluation  Patient identified by MRN, date of birth, ID band Patient awake    Reviewed: Allergy & Precautions, H&P , NPO status , Patient's Chart, lab work & pertinent test results  Airway Mallampati: II  TM Distance: >3 FB Neck ROM: Full    Dental no notable dental hx. (+) Teeth Intact, Dental Advisory Given   Pulmonary neg pulmonary ROS, Patient abstained from smoking., former smoker,    Pulmonary exam normal breath sounds clear to auscultation       Cardiovascular Exercise Tolerance: Good hypertension, Pt. on medications  Rhythm:Regular Rate:Normal     Neuro/Psych  Headaches, Anxiety Depression    GI/Hepatic Neg liver ROS, GERD  ,  Endo/Other  negative endocrine ROS  Renal/GU negative Renal ROS  negative genitourinary   Musculoskeletal  (+) Arthritis , Osteoarthritis,    Abdominal   Peds  Hematology negative hematology ROS (+)   Anesthesia Other Findings   Reproductive/Obstetrics negative OB ROS                            Anesthesia Physical Anesthesia Plan  ASA: II  Anesthesia Plan: Spinal   Post-op Pain Management:    Induction: Intravenous  PONV Risk Score and Plan: 3 and Ondansetron, Dexamethasone, Propofol infusion and Midazolam  Airway Management Planned: Simple Face Mask and Natural Airway  Additional Equipment:   Intra-op Plan:   Post-operative Plan:   Informed Consent: I have reviewed the patients History and Physical, chart, labs and discussed the procedure including the risks, benefits and alternatives for the proposed anesthesia with the patient or authorized representative who has indicated his/her understanding and acceptance.     Dental advisory given  Plan Discussed with: CRNA  Anesthesia Plan Comments:        Anesthesia Quick Evaluation

## 2020-05-27 ENCOUNTER — Ambulatory Visit (HOSPITAL_COMMUNITY): Payer: Medicare PPO | Admitting: Anesthesiology

## 2020-05-27 ENCOUNTER — Encounter (HOSPITAL_COMMUNITY)
Admission: RE | Disposition: A | Discharge status: Home / Self Care | Payer: Self-pay | Source: Ambulatory Visit | Attending: Orthopedic Surgery

## 2020-05-27 ENCOUNTER — Ambulatory Visit (HOSPITAL_COMMUNITY): Payer: Medicare PPO

## 2020-05-27 ENCOUNTER — Encounter (HOSPITAL_COMMUNITY): Payer: Self-pay | Admitting: Orthopedic Surgery

## 2020-05-27 ENCOUNTER — Observation Stay (HOSPITAL_COMMUNITY)
Admission: RE | Admit: 2020-05-27 | Discharge: 2020-05-27 | Disposition: A | Discharge status: Home / Self Care | Payer: Medicare PPO | Source: Ambulatory Visit | Attending: Orthopedic Surgery | Admitting: Orthopedic Surgery

## 2020-05-27 ENCOUNTER — Ambulatory Visit (HOSPITAL_COMMUNITY): Payer: Medicare PPO | Admitting: Physician Assistant

## 2020-05-27 DIAGNOSIS — Z87891 Personal history of nicotine dependence: Secondary | ICD-10-CM | POA: Insufficient documentation

## 2020-05-27 DIAGNOSIS — Z79899 Other long term (current) drug therapy: Secondary | ICD-10-CM | POA: Insufficient documentation

## 2020-05-27 DIAGNOSIS — Z20822 Contact with and (suspected) exposure to covid-19: Secondary | ICD-10-CM | POA: Diagnosis not present

## 2020-05-27 DIAGNOSIS — Z7982 Long term (current) use of aspirin: Secondary | ICD-10-CM | POA: Diagnosis not present

## 2020-05-27 DIAGNOSIS — M1611 Unilateral primary osteoarthritis, right hip: Secondary | ICD-10-CM | POA: Diagnosis not present

## 2020-05-27 DIAGNOSIS — I10 Essential (primary) hypertension: Secondary | ICD-10-CM | POA: Diagnosis not present

## 2020-05-27 DIAGNOSIS — Z09 Encounter for follow-up examination after completed treatment for conditions other than malignant neoplasm: Secondary | ICD-10-CM

## 2020-05-27 DIAGNOSIS — Z419 Encounter for procedure for purposes other than remedying health state, unspecified: Secondary | ICD-10-CM

## 2020-05-27 HISTORY — PX: TOTAL HIP ARTHROPLASTY: SHX124

## 2020-05-27 LAB — COMPREHENSIVE METABOLIC PANEL
ALT: 17 U/L (ref 0–44)
AST: 22 U/L (ref 15–41)
Albumin: 3.9 g/dL (ref 3.5–5.0)
Alkaline Phosphatase: 114 U/L (ref 38–126)
Anion gap: 10 (ref 5–15)
BUN: 16 mg/dL (ref 6–20)
CO2: 25 mmol/L (ref 22–32)
Calcium: 9.1 mg/dL (ref 8.9–10.3)
Chloride: 105 mmol/L (ref 98–111)
Creatinine, Ser: 0.93 mg/dL (ref 0.44–1.00)
GFR, Estimated: >60 mL/min (ref >60)
Glucose, Bld: 64 mg/dL — ABNORMAL LOW (ref 70–99)
Potassium: 3.8 mmol/L (ref 3.5–5.1)
Sodium: 140 mmol/L (ref 135–145)
Total Bilirubin: 0.2 mg/dL — ABNORMAL LOW (ref 0.3–1.2)
Total Protein: 7.7 g/dL (ref 6.5–8.1)

## 2020-05-27 LAB — SURGICAL PCR SCREEN
MRSA, PCR: NEGATIVE
Staphylococcus aureus: NEGATIVE

## 2020-05-27 LAB — PROTIME-INR
INR: 0.9 (ref 0.8–1.2)
Prothrombin Time: 12 seconds (ref 11.4–15.2)

## 2020-05-27 LAB — CBC
HCT: 40.2 % (ref 36.0–46.0)
Hemoglobin: 13.1 g/dL (ref 12.0–15.0)
MCH: 30.5 pg (ref 26.0–34.0)
MCHC: 32.6 g/dL (ref 30.0–36.0)
MCV: 93.7 fL (ref 80.0–100.0)
Platelets: 269 10*3/uL (ref 150–400)
RBC: 4.29 MIL/uL (ref 3.87–5.11)
RDW: 14 % (ref 11.5–15.5)
WBC: 9.5 10*3/uL (ref 4.0–10.5)
nRBC: 0 % (ref 0.0–0.2)

## 2020-05-27 LAB — TYPE AND SCREEN
ABO/RH(D): A POS
Antibody Screen: NEGATIVE

## 2020-05-27 LAB — SARS CORONAVIRUS 2 BY RT PCR (HOSPITAL ORDER, PERFORMED IN ~~LOC~~ HOSPITAL LAB): SARS Coronavirus 2: NEGATIVE

## 2020-05-27 SURGERY — ARTHROPLASTY, HIP, TOTAL, ANTERIOR APPROACH
Anesthesia: Spinal | Site: Hip | Laterality: Right

## 2020-05-27 MED ORDER — DEXAMETHASONE SODIUM PHOSPHATE 10 MG/ML IJ SOLN: 10.0000 mg | Freq: Once | INTRAMUSCULAR | Status: DC

## 2020-05-27 MED ORDER — METHOCARBAMOL 500 MG IVPB - SIMPLE MED: 500.0000 mg | Freq: Four times a day (QID) | INTRAVENOUS | Status: DC | PRN

## 2020-05-27 MED ORDER — KETOROLAC TROMETHAMINE 30 MG/ML IJ SOLN
INTRAMUSCULAR | Status: AC
  Filled 2020-05-27: qty 1

## 2020-05-27 MED ORDER — LIDOCAINE 2% (20 MG/ML) 5 ML SYRINGE
INTRAMUSCULAR | Status: DC | PRN
  Administered 2020-05-27: 50 mg via INTRAVENOUS

## 2020-05-27 MED ORDER — POVIDONE-IODINE 10 % EX SWAB: 2.0000 "application " | Freq: Once | CUTANEOUS | Status: DC

## 2020-05-27 MED ORDER — KETOROLAC TROMETHAMINE 15 MG/ML IJ SOLN: 15.0000 mg | Freq: Four times a day (QID) | INTRAMUSCULAR | Status: DC

## 2020-05-27 MED ORDER — LEVOCETIRIZINE DIHYDROCHLORIDE 5 MG PO TABS: 5.0000 mg | ORAL_TABLET | Freq: Every day | ORAL | Status: DC

## 2020-05-27 MED ORDER — METHOCARBAMOL 500 MG PO TABS: 500.0000 mg | ORAL_TABLET | Freq: Four times a day (QID) | ORAL | Status: DC | PRN

## 2020-05-27 MED ORDER — LACTATED RINGERS IV SOLN: INTRAVENOUS | Status: DC

## 2020-05-27 MED ORDER — TRANEXAMIC ACID-NACL 1000-0.7 MG/100ML-% IV SOLN
1000.0000 mg | INTRAVENOUS | Status: AC
  Administered 2020-05-27: 1000 mg via INTRAVENOUS
  Filled 2020-05-27: qty 100

## 2020-05-27 MED ORDER — FAMOTIDINE 20 MG PO TABS: 40.0000 mg | ORAL_TABLET | Freq: Two times a day (BID) | ORAL | Status: DC

## 2020-05-27 MED ORDER — DOCUSATE SODIUM 100 MG PO CAPS
100.0000 mg | ORAL_CAPSULE | Freq: Two times a day (BID) | ORAL | Status: DC
Start: 2020-05-27 — End: 2020-05-27

## 2020-05-27 MED ORDER — MIDAZOLAM HCL 2 MG/2ML IJ SOLN
INTRAMUSCULAR | Status: AC
  Filled 2020-05-27: qty 2

## 2020-05-27 MED ORDER — ONDANSETRON HCL 4 MG PO TABS: 4.0000 mg | ORAL_TABLET | Freq: Four times a day (QID) | ORAL | Status: DC | PRN

## 2020-05-27 MED ORDER — DIPHENHYDRAMINE HCL 12.5 MG/5ML PO ELIX
12.5000 mg | ORAL_SOLUTION | ORAL | Status: DC | PRN
  Filled 2020-05-27: qty 10

## 2020-05-27 MED ORDER — WATER FOR IRRIGATION, STERILE IR SOLN
Status: DC | PRN
  Administered 2020-05-27: 1000 mL

## 2020-05-27 MED ORDER — DEXAMETHASONE SODIUM PHOSPHATE 10 MG/ML IJ SOLN
INTRAMUSCULAR | Status: DC | PRN
  Administered 2020-05-27: 10 mg via INTRAVENOUS

## 2020-05-27 MED ORDER — SODIUM CHLORIDE 0.9 % IV SOLN: INTRAVENOUS | Status: DC

## 2020-05-27 MED ORDER — PHENOL 1.4 % MT LIQD: 1.0000 | OROMUCOSAL | Status: DC | PRN

## 2020-05-27 MED ORDER — HYDROCODONE-ACETAMINOPHEN 7.5-325 MG PO TABS: 1.0000 | ORAL_TABLET | ORAL | Status: DC | PRN

## 2020-05-27 MED ORDER — LACTATED RINGERS IV BOLUS
500.0000 mL | Freq: Once | INTRAVENOUS | Status: AC
  Administered 2020-05-27: 500 mL via INTRAVENOUS

## 2020-05-27 MED ORDER — PROPOFOL 1000 MG/100ML IV EMUL
INTRAVENOUS | Status: AC
  Filled 2020-05-27: qty 100

## 2020-05-27 MED ORDER — CEFAZOLIN SODIUM-DEXTROSE 2-4 GM/100ML-% IV SOLN
INTRAVENOUS | Status: AC
  Administered 2020-05-27: 2 g via INTRAVENOUS
  Filled 2020-05-27: qty 100

## 2020-05-27 MED ORDER — CHLORHEXIDINE GLUCONATE 0.12 % MT SOLN
15.0000 mL | Freq: Once | OROMUCOSAL | Status: AC
  Administered 2020-05-27: 15 mL via OROMUCOSAL

## 2020-05-27 MED ORDER — LACTATED RINGERS IV BOLUS
250.0000 mL | Freq: Once | INTRAVENOUS | Status: AC
  Administered 2020-05-27: 250 mL via INTRAVENOUS

## 2020-05-27 MED ORDER — MENTHOL 3 MG MT LOZG: 1.0000 | LOZENGE | OROMUCOSAL | Status: DC | PRN

## 2020-05-27 MED ORDER — DISULFIRAM 250 MG PO TABS: 125.0000 mg | ORAL_TABLET | ORAL | Status: DC

## 2020-05-27 MED ORDER — BUPIVACAINE-EPINEPHRINE 0.25% -1:200000 IJ SOLN
INTRAMUSCULAR | Status: DC | PRN
  Administered 2020-05-27: 30 mL

## 2020-05-27 MED ORDER — SODIUM CHLORIDE (PF) 0.9 % IJ SOLN
INTRAMUSCULAR | Status: DC | PRN
  Administered 2020-05-27: 30 mL

## 2020-05-27 MED ORDER — DOCUSATE SODIUM 100 MG PO CAPS: 100.0000 mg | ORAL_CAPSULE | Freq: Two times a day (BID) | ORAL | 1 refills | Status: AC

## 2020-05-27 MED ORDER — POLYETHYLENE GLYCOL 3350 17 G PO PACK: 17.0000 g | PACK | Freq: Every day | ORAL | Status: DC | PRN

## 2020-05-27 MED ORDER — SODIUM CHLORIDE 0.9 % IR SOLN
Status: DC | PRN
  Administered 2020-05-27: 2000 mL

## 2020-05-27 MED ORDER — ALUM & MAG HYDROXIDE-SIMETH 200-200-20 MG/5ML PO SUSP
30.0000 mL | ORAL | Status: DC | PRN
Start: 2020-05-27 — End: 2020-05-27

## 2020-05-27 MED ORDER — ACETAMINOPHEN 500 MG PO TABS: 1000.0000 mg | ORAL_TABLET | Freq: Once | ORAL | Status: DC

## 2020-05-27 MED ORDER — CEFAZOLIN SODIUM-DEXTROSE 2-4 GM/100ML-% IV SOLN: 2.0000 g | Freq: Four times a day (QID) | INTRAVENOUS | Status: DC

## 2020-05-27 MED ORDER — FLUOXETINE HCL 40 MG PO CAPS: 40.0000 mg | ORAL_CAPSULE | Freq: Two times a day (BID) | ORAL | Status: DC

## 2020-05-27 MED ORDER — HYDROMORPHONE HCL 1 MG/ML IJ SOLN
0.2500 mg | INTRAMUSCULAR | Status: DC | PRN
Start: 2020-05-27 — End: 2020-05-27

## 2020-05-27 MED ORDER — METOCLOPRAMIDE HCL 5 MG/ML IJ SOLN: 5.0000 mg | Freq: Three times a day (TID) | INTRAMUSCULAR | Status: DC | PRN

## 2020-05-27 MED ORDER — IRRISEPT - 450ML BOTTLE WITH 0.05% CHG IN STERILE WATER, USP 99.95% OPTIME
TOPICAL | Status: DC | PRN
  Administered 2020-05-27: 450 mL

## 2020-05-27 MED ORDER — ACETAMINOPHEN 10 MG/ML IV SOLN
1000.0000 mg | Freq: Once | INTRAVENOUS | Status: AC
  Administered 2020-05-27: 1000 mg via INTRAVENOUS
  Filled 2020-05-27: qty 100

## 2020-05-27 MED ORDER — HYDROCODONE-ACETAMINOPHEN 5-325 MG PO TABS: 1.0000 | ORAL_TABLET | ORAL | 0 refills | Status: DC | PRN

## 2020-05-27 MED ORDER — ALBUTEROL SULFATE HFA 108 (90 BASE) MCG/ACT IN AERS: 2.0000 | INHALATION_SPRAY | Freq: Four times a day (QID) | RESPIRATORY_TRACT | Status: DC | PRN

## 2020-05-27 MED ORDER — BUSPIRONE HCL 5 MG PO TABS: 5.0000 mg | ORAL_TABLET | Freq: Every day | ORAL | Status: DC

## 2020-05-27 MED ORDER — ONDANSETRON HCL 4 MG PO TABS: 4.0000 mg | ORAL_TABLET | Freq: Three times a day (TID) | ORAL | 0 refills | Status: AC | PRN

## 2020-05-27 MED ORDER — ASPIRIN 81 MG PO CHEW: 81.0000 mg | CHEWABLE_TABLET | Freq: Two times a day (BID) | ORAL | 0 refills | Status: AC

## 2020-05-27 MED ORDER — FENTANYL CITRATE (PF) 100 MCG/2ML IJ SOLN
INTRAMUSCULAR | Status: DC | PRN
  Administered 2020-05-27 (×2): 50 ug via INTRAVENOUS

## 2020-05-27 MED ORDER — ISOPROPYL ALCOHOL 70 % SOLN
Status: DC | PRN
  Administered 2020-05-27: 1 via TOPICAL

## 2020-05-27 MED ORDER — MIDAZOLAM HCL 2 MG/2ML IJ SOLN
INTRAMUSCULAR | Status: DC | PRN
  Administered 2020-05-27: 2 mg via INTRAVENOUS

## 2020-05-27 MED ORDER — SODIUM CHLORIDE (PF) 0.9 % IJ SOLN
INTRAMUSCULAR | Status: AC
  Filled 2020-05-27: qty 30

## 2020-05-27 MED ORDER — CEFAZOLIN SODIUM-DEXTROSE 2-4 GM/100ML-% IV SOLN
2.0000 g | INTRAVENOUS | Status: AC
  Administered 2020-05-27: 2 g via INTRAVENOUS
  Filled 2020-05-27: qty 100

## 2020-05-27 MED ORDER — EPHEDRINE SULFATE-NACL 50-0.9 MG/10ML-% IV SOSY
PREFILLED_SYRINGE | INTRAVENOUS | Status: DC | PRN
  Administered 2020-05-27 (×2): 10 mg via INTRAVENOUS

## 2020-05-27 MED ORDER — POVIDONE-IODINE 10 % EX SWAB
2.0000 "application " | Freq: Once | CUTANEOUS | Status: AC
  Administered 2020-05-27: 2 via TOPICAL

## 2020-05-27 MED ORDER — KETOROLAC TROMETHAMINE 30 MG/ML IJ SOLN
INTRAMUSCULAR | Status: DC | PRN
  Administered 2020-05-27: 30 mg

## 2020-05-27 MED ORDER — CELECOXIB 200 MG PO CAPS
200.0000 mg | ORAL_CAPSULE | Freq: Once | ORAL | Status: AC
  Administered 2020-05-27: 200 mg via ORAL
  Filled 2020-05-27: qty 1

## 2020-05-27 MED ORDER — SENNA 8.6 MG PO TABS: 2.0000 | ORAL_TABLET | Freq: Every day | ORAL | 1 refills | Status: AC

## 2020-05-27 MED ORDER — POLYETHYL GLYCOL-PROPYL GLYCOL 0.4-0.3 % OP SOLN: 1.0000 [drp] | Freq: Three times a day (TID) | OPHTHALMIC | Status: DC | PRN

## 2020-05-27 MED ORDER — ONDANSETRON HCL 4 MG/2ML IJ SOLN
INTRAMUSCULAR | Status: DC | PRN
  Administered 2020-05-27: 4 mg via INTRAVENOUS

## 2020-05-27 MED ORDER — ONDANSETRON HCL 4 MG/2ML IJ SOLN: 4.0000 mg | Freq: Four times a day (QID) | INTRAMUSCULAR | Status: DC | PRN

## 2020-05-27 MED ORDER — ROPINIROLE HCL 0.5 MG PO TABS: 0.5000 mg | ORAL_TABLET | Freq: Every day | ORAL | Status: DC

## 2020-05-27 MED ORDER — MORPHINE SULFATE (PF) 4 MG/ML IV SOLN: 0.5000 mg | INTRAVENOUS | Status: DC | PRN

## 2020-05-27 MED ORDER — METOCLOPRAMIDE HCL 5 MG PO TABS: 5.0000 mg | ORAL_TABLET | Freq: Three times a day (TID) | ORAL | Status: DC | PRN

## 2020-05-27 MED ORDER — ACETAMINOPHEN 325 MG PO TABS: 325.0000 mg | ORAL_TABLET | Freq: Four times a day (QID) | ORAL | Status: DC | PRN

## 2020-05-27 MED ORDER — HYDROCODONE-ACETAMINOPHEN 5-325 MG PO TABS: 1.0000 | ORAL_TABLET | ORAL | Status: DC | PRN

## 2020-05-27 MED ORDER — ASPIRIN 81 MG PO CHEW
81.0000 mg | CHEWABLE_TABLET | Freq: Two times a day (BID) | ORAL | Status: DC
Start: 2020-05-27 — End: 2020-05-27

## 2020-05-27 MED ORDER — BUPIVACAINE-EPINEPHRINE (PF) 0.25% -1:200000 IJ SOLN
INTRAMUSCULAR | Status: AC
  Filled 2020-05-27: qty 30

## 2020-05-27 MED ORDER — ORAL CARE MOUTH RINSE: 15.0000 mL | Freq: Once | OROMUCOSAL | Status: AC

## 2020-05-27 MED ORDER — METHOCARBAMOL 500 MG IVPB - SIMPLE MED
INTRAVENOUS | Status: AC
  Administered 2020-05-27: 500 mg via INTRAVENOUS
  Filled 2020-05-27: qty 50

## 2020-05-27 MED ORDER — FENTANYL CITRATE (PF) 100 MCG/2ML IJ SOLN
INTRAMUSCULAR | Status: AC
  Filled 2020-05-27: qty 2

## 2020-05-27 MED ORDER — BUPIVACAINE IN DEXTROSE 0.75-8.25 % IT SOLN
INTRATHECAL | Status: DC | PRN
  Administered 2020-05-27: 1.8 mL via INTRATHECAL

## 2020-05-27 MED ORDER — ATORVASTATIN CALCIUM 20 MG PO TABS: 20.0000 mg | ORAL_TABLET | Freq: Every day | ORAL | Status: DC

## 2020-05-27 MED ORDER — PROPOFOL 500 MG/50ML IV EMUL
INTRAVENOUS | Status: DC | PRN
  Administered 2020-05-27: 40 mg via INTRAVENOUS
  Administered 2020-05-27: 50 ug/kg/min via INTRAVENOUS

## 2020-05-27 MED ORDER — ARIPIPRAZOLE 2 MG PO TABS: 2.0000 mg | ORAL_TABLET | Freq: Every day | ORAL | Status: DC

## 2020-05-27 SURGICAL SUPPLY — 62 items
BAG DECANTER FOR FLEXI CONT (MISCELLANEOUS) IMPLANT
BAG ZIPLOCK 12X15 (MISCELLANEOUS) IMPLANT
BALL HIP CERAMIC 32MM PLUS 9 ×1 IMPLANT
BLADE SURG SZ10 CARB STEEL (BLADE) IMPLANT
CHLORAPREP W/TINT 26 (MISCELLANEOUS) ×2 IMPLANT
COVER PERINEAL POST (MISCELLANEOUS) ×2 IMPLANT
COVER SURGICAL LIGHT HANDLE (MISCELLANEOUS) ×2 IMPLANT
COVER WAND RF STERILE (DRAPES) IMPLANT
DECANTER SPIKE VIAL GLASS SM (MISCELLANEOUS) ×2 IMPLANT
DERMABOND ADVANCED (GAUZE/BANDAGES/DRESSINGS) ×1
DERMABOND ADVANCED .7 DNX12 (GAUZE/BANDAGES/DRESSINGS) ×1 IMPLANT
DRAPE IMP U-DRAPE 54X76 (DRAPES) ×2 IMPLANT
DRAPE SHEET LG 3/4 BI-LAMINATE (DRAPES) ×6 IMPLANT
DRAPE STERI IOBAN 125X83 (DRAPES) IMPLANT
DRAPE U-SHAPE 47X51 STRL (DRAPES) ×4 IMPLANT
DRSG AQUACEL AG ADV 3.5X10 (GAUZE/BANDAGES/DRESSINGS) ×2 IMPLANT
DRSG AQUACEL AG ADV 3.5X14 (GAUZE/BANDAGES/DRESSINGS) ×2 IMPLANT
ELECT REM PT RETURN 15FT ADLT (MISCELLANEOUS) ×2 IMPLANT
GAUZE SPONGE 4X4 12PLY STRL (GAUZE/BANDAGES/DRESSINGS) ×2 IMPLANT
GLOVE BIO SURGEON STRL SZ8.5 (GLOVE) ×4 IMPLANT
GLOVE BIOGEL PI IND STRL 8.5 (GLOVE) ×1 IMPLANT
GLOVE BIOGEL PI INDICATOR 8.5 (GLOVE) ×1
GLOVE SRG 8 PF TXTR STRL LF DI (GLOVE) ×1 IMPLANT
GLOVE SURG ENC TEXT LTX SZ7.5 (GLOVE) ×4 IMPLANT
GLOVE SURG UNDER POLY LF SZ8 (GLOVE) ×1
GOWN SPEC L3 XXLG W/TWL (GOWN DISPOSABLE) ×2 IMPLANT
GOWN STRL REUS W/ TWL LRG LVL3 (GOWN DISPOSABLE) ×1 IMPLANT
GOWN STRL REUS W/TWL LRG LVL3 (GOWN DISPOSABLE) ×1
HANDPIECE INTERPULSE COAX TIP (DISPOSABLE) ×1
HIP BALL CERAMIC 32MM PLUS 9 ×2 IMPLANT
HOLDER FOLEY CATH W/STRAP (MISCELLANEOUS) ×2 IMPLANT
HOOD PEEL AWAY FLYTE STAYCOOL (MISCELLANEOUS) ×8 IMPLANT
JET LAVAGE IRRISEPT WOUND (IRRIGATION / IRRIGATOR) ×2
KIT TURNOVER KIT A (KITS) IMPLANT
LAVAGE JET IRRISEPT WOUND (IRRIGATION / IRRIGATOR) ×1 IMPLANT
LINER PINN ACET NEUT 32X52 ×2 IMPLANT
MANIFOLD NEPTUNE II (INSTRUMENTS) ×2 IMPLANT
MARKER SKIN DUAL TIP RULER LAB (MISCELLANEOUS) ×2 IMPLANT
NDL SAFETY ECLIPSE 18X1.5 (NEEDLE) ×1 IMPLANT
NEEDLE HYPO 18GX1.5 SHARP (NEEDLE) ×1
NEEDLE SPNL 18GX3.5 QUINCKE PK (NEEDLE) ×2 IMPLANT
PACK ANTERIOR HIP CUSTOM (KITS) ×2 IMPLANT
PENCIL SMOKE EVACUATOR (MISCELLANEOUS) IMPLANT
PIN SECTOR W/GRIP ACE CUP 52MM (Hips) ×2 IMPLANT
SAW OSC TIP CART 19.5X105X1.3 (SAW) ×2 IMPLANT
SEALER BIPOLAR AQUA 6.0 (INSTRUMENTS) ×2 IMPLANT
SET HNDPC FAN SPRY TIP SCT (DISPOSABLE) ×1 IMPLANT
STEM TRI LOC BPS SZ6 W GRIPTON ×1 IMPLANT
SUT ETHIBOND NAB CT1 #1 30IN (SUTURE) ×4 IMPLANT
SUT MNCRL AB 3-0 PS2 18 (SUTURE) ×2 IMPLANT
SUT MNCRL AB 4-0 PS2 18 (SUTURE) ×2 IMPLANT
SUT MON AB 2-0 CT1 36 (SUTURE) ×4 IMPLANT
SUT STRATAFIX PDO 1 14 VIOLET (SUTURE) ×1
SUT STRATFX PDO 1 14 VIOLET (SUTURE) ×1
SUT VIC AB 2-0 CT1 27 (SUTURE) ×1
SUT VIC AB 2-0 CT1 TAPERPNT 27 (SUTURE) ×1 IMPLANT
SUTURE STRATFX PDO 1 14 VIOLET (SUTURE) ×1 IMPLANT
SYR 3ML LL SCALE MARK (SYRINGE) ×2 IMPLANT
TRAY FOLEY MTR SLVR 16FR STAT (SET/KITS/TRAYS/PACK) IMPLANT
TRI LOC BPS SZ 6 W GRIPTON ×2 IMPLANT
TUBE SUCTION HIGH CAP CLEAR NV (SUCTIONS) ×2 IMPLANT
WATER STERILE IRR 1000ML POUR (IV SOLUTION) ×2 IMPLANT

## 2020-05-27 NOTE — Transfer of Care (Signed)
Immediate Anesthesia Transfer of Care Note  Patient: Julia Stewart  Procedure(s) Performed: Procedure(s): TOTAL HIP ARTHROPLASTY ANTERIOR APPROACH (Right)  Patient Location: PACU  Anesthesia Type:Spinal  Level of Consciousness: awake, alert  and oriented  Airway & Oxygen Therapy: Patient Spontanous Breathing  Post-op Assessment: Report given to RN and Post -op Vital signs reviewed and stable  Post vital signs: Reviewed and stable  Last Vitals:  Vitals:   05/27/20 0650  BP: (!) 152/90  Pulse: 64  Resp: 18  SpO2: 974%    Complications: No apparent anesthesia complications

## 2020-05-27 NOTE — Interval H&P Note (Signed)
History and Physical Interval Note:  05/27/2020 8:43 AM  Julia Stewart  has presented today for surgery, with the diagnosis of degenerative joint disease right hip.  The various methods of treatment have been discussed with the patient and family. After consideration of risks, benefits and other options for treatment, the patient has consented to  Procedure(s): TOTAL HIP ARTHROPLASTY ANTERIOR APPROACH (Right) as a surgical intervention.  The patient's history has been reviewed, patient examined, no change in status, stable for surgery.  I have reviewed the patient's chart and labs.  Questions were answered to the patient's satisfaction.    The risks, benefits, and alternatives were discussed with the patient. There are risks associated with the surgery including, but not limited to, problems with anesthesia (death), infection, instability (giving out of the joint), dislocation, differences in leg length/angulation/rotation, fracture of bones, loosening or failure of implants, hematoma (blood accumulation) which may require surgical drainage, blood clots, pulmonary embolism, nerve injury (foot drop and lateral thigh numbness), and blood vessel injury. The patient understands these risks and elects to proceed.   Hilton Cork Solveig Fangman

## 2020-05-27 NOTE — Evaluation (Signed)
Physical Therapy Evaluation Patient Details Name: Julia Stewart MRN: [id number] DOB: [birth year] Today's Date: 05/27/2020   History of Present Illness  Patient is 60 y.o. female s/p Rt THA anterior approach on 05/27/20 with PMH significant for HTN, GERD, OA, anxiety, lyme disease.    Clinical Impression  Julia Stewart is a 60 y.o. female POD 0 s/p Rt THA. Patient reports independence with [facility] for mobility at baseline. Patient is now limited by functional impairments (see PT problem list below) and requires min guard/supervision for transfers and gait with RW. Patient was able to ambulate ~140 feet with RW and min guard/supervision and cues for safe walker management. Patient educated on safe sequencing for stair mobility and verbalized safe guarding position for people assisting with mobility. Patient's sister present and provided guarding during gait and verbalized understanding for stair mobility. Patient instructed in exercises to facilitate ROM and circulation. Patient will benefit from continued skilled PT interventions to address impairments and progress towards PLOF. Patient has met mobility goals at adequate level for discharge home; will continue to follow if pt continues acute stay to progress towards Mod I goals.     Follow Up Recommendations Follow surgeon's recommendation for DC plan and follow-up therapies;Home health PT    Equipment Recommendations  Rolling walker with 5" wheels (delivered in PACU)    Recommendations for Other Services       Precautions / Restrictions Precautions Precautions: Fall Restrictions Weight Bearing Restrictions: No Other Position/Activity Restrictions: WBAT      Mobility  Bed Mobility Overal bed mobility: Needs Assistance Bed Mobility: Supine to Sit;Sit to Supine     Supine to sit: Min guard;Supervision Sit to supine: Min guard;Supervision   General bed mobility comments: pt taking extrat time to sit EOB, no assist  required. Cues for use of gait belt to raise Rt LE onto bed at EOS.    Transfers Overall transfer level: Needs assistance Equipment used: Rolling walker (2 wheeled) Transfers: Sit to/from Stand Sit to Stand: Min guard;Supervision         General transfer comment: cues for technique with RW. guard for safety with stand from EOB and sit<>stand from toilet. pt using grab bar by toilet. supervision for sit EOB at EOS.  Ambulation/Gait Ambulation/Gait assistance: Min guard;Supervision Gait Distance (Feet): 140 Feet Assistive device: Rolling walker (2 wheeled) Gait Pattern/deviations: Step-to pattern;Step-through pattern;Decreased stride length;Decreased dorsiflexion - right Gait velocity: decr   General Gait Details: VC's for safe proximity to RW and step to pattern, pt progressed to step through pattern. no overt LOB noted with gait. pt with slight hike on Rt LE to clear foot due to baseline Rt foot drop.  Stairs Stairs: Yes Stairs assistance: Min guard Stair Management: One rail Right;Step to pattern;Sideways Number of Stairs: 3 General stair comments: cues for sequenicng "up with good, down with bad" and for safe guarding position of family. no overt LOB noted and pt verbalized assist from family.  Wheelchair Mobility    Modified Rankin (Stroke Patients Only)       Balance Overall balance assessment: Needs assistance Sitting-balance support: Feet supported Sitting balance-Leahy Scale: Good     Standing balance support: During functional activity;Bilateral upper extremity supported Standing balance-Leahy Scale: Poor Standing balance comment: reliant on RW for support                             Pertinent Vitals/Pain Pain Assessment: 0-10 Pain Score: 2  Pain Location:  Rt hip Pain Descriptors / Indicators: Aching;Burning Pain Intervention(s): Limited activity within patient's tolerance;Monitored during session;Repositioned    Home Living Family/patient  expects to be discharged to:: Private residence Living Arrangements: Alone Available Help at Discharge: Family Type of Home: House Home Access: Stairs to enter Entrance Stairs-Rails: Psychiatric nurse of Steps: 4 Home Layout: One level Home Equipment: Grab bars - tub/shower;Cane - single point;Tub bench;Grab bars - toilet      Prior Function Level of Independence: Independent with assistive device(s)         Comments: pt has been mobilizing with SPC for gait. she reports falls due to Rt foot drop at baseline.     Hand Dominance        Extremity/Trunk Assessment   Upper Extremity Assessment Upper Extremity Assessment: Overall WFL for tasks assessed    Lower Extremity Assessment Lower Extremity Assessment: RLE deficits/detail RLE Deficits / Details: good quad activation. performs partial SLR. pt with Rt foot drop at baseline (1/5 for strength), reports she purchased OTC AFO to try. good plantar flexion strength. RLE Sensation: WNL RLE Coordination: decreased gross motor    Cervical / Trunk Assessment Cervical / Trunk Assessment: Normal  Communication   Communication: No difficulties  Cognition Arousal/Alertness: Awake/alert Behavior During Therapy: WFL for tasks assessed/performed Overall Cognitive Status: Within Functional Limits for tasks assessed                                        General Comments      Exercises Total Joint Exercises Ankle Circles/Pumps: AROM;Both;10 reps;Supine Quad Sets: AROM;Right;Supine;Other reps (comment) (3) Short Arc Quad: AROM;Right;Supine;Other reps (comment) (3) Heel Slides: AROM;Right;Supine;Other reps (comment) (3) Hip ABduction/ADduction: AROM;Right;Supine;Other reps (comment) (3)   Assessment/Plan    PT Assessment Patient needs continued PT services  PT Problem List Decreased strength;Decreased range of motion;Decreased activity tolerance;Decreased balance;Decreased mobility;Decreased  knowledge of use of DME;Decreased knowledge of precautions       PT Treatment Interventions DME instruction;Gait training;Stair training;Functional mobility training;Therapeutic activities;Therapeutic exercise;Balance training;Patient/family education;Neuromuscular re-education    PT Goals (Current goals can be found in the Care Plan section)  Acute Rehab PT Goals Patient Stated Goal: regain independence and return home PT Goal Formulation: With patient Time For Goal Achievement: 06/03/20 Potential to Achieve Goals: Good    Frequency 7X/week   Barriers to discharge        Co-evaluation               AM-PAC PT "6 Clicks" Mobility  Outcome Measure Help needed turning from your back to your side while in a flat bed without using bedrails?: None Help needed moving from lying on your back to sitting on the side of a flat bed without using bedrails?: A Little Help needed moving to and from a bed to a chair (including a wheelchair)?: A Little Help needed standing up from a chair using your arms (e.g., wheelchair or bedside chair)?: A Little Help needed to walk in hospital room?: A Little Help needed climbing 3-5 steps with a railing? : A Little 6 Click Score: 19    End of Session Equipment Utilized During Treatment: Gait belt Activity Tolerance: Patient tolerated treatment well Patient left: in bed;with call bell/phone within reach;with family/visitor present Nurse Communication: Mobility status PT Visit Diagnosis: Muscle weakness (generalized) (M62.81);Difficulty in walking, not elsewhere classified (R26.2)    Time: 5176-1607 PT Time Calculation (min) (ACUTE ONLY):  36 min   Charges:   PT Evaluation $PT Eval Low Complexity: 1 Low PT Treatments $Gait Training: 8-22 mins        Verner Mould, DPT Acute Rehabilitation Services Office [phone] Pager [phone]    Jacques Navy 05/27/2020, 3:17 PM

## 2020-05-27 NOTE — Anesthesia Postprocedure Evaluation (Signed)
Anesthesia Post Note  Patient: Julia Stewart  Procedure(s) Performed: TOTAL HIP ARTHROPLASTY ANTERIOR APPROACH (Right Hip)     Patient location during evaluation: PACU Anesthesia Type: Spinal Level of consciousness: oriented and awake and alert Pain management: pain level controlled Vital Signs Assessment: post-procedure vital signs reviewed and stable Respiratory status: spontaneous breathing, respiratory function stable and patient connected to nasal cannula oxygen Cardiovascular status: blood pressure returned to baseline and stable Postop Assessment: no headache, no backache, no apparent nausea or vomiting, spinal receding and patient able to bend at knees Anesthetic complications: no   No complications documented.  Last Vitals:  Vitals:   05/27/20 1245 05/27/20 1326  BP: (!) 157/91 139/87  Pulse: 79 79  Resp: 19 18  Temp: 36.6 C 36.7 C  SpO2: 100% 98%    Last Pain:  Vitals:   05/27/20 1326  TempSrc: Oral  PainSc:                  [name],W. EDMOND

## 2020-05-27 NOTE — Anesthesia Procedure Notes (Signed)
Spinal  Patient location during procedure: OR Start time: 05/27/2020 8:56 AM Staffing Performed: resident/CRNA  Anesthesiologist: Roderic Palau, MD Resident/CRNA: Gerald Leitz, CRNA Preanesthetic Checklist Completed: patient identified, IV checked, site marked, risks and benefits discussed, surgical consent, monitors and equipment checked, pre-op evaluation and timeout performed Spinal Block Patient position: sitting Prep: DuraPrep Patient monitoring: heart rate, continuous pulse ox, blood pressure and cardiac monitor Approach: midline Location: L3-4 Injection technique: single-shot Needle Needle type: Introducer and Pencan  Needle gauge: 25 G Needle length: 9 cm Assessment Sensory level: T4 Additional Notes Negative paresthesia. Negative blood return. Positive free-flowing CSF. Expiration date of kit checked and confirmed. Patient tolerated procedure well, without complications.

## 2020-05-27 NOTE — Op Note (Addendum)
OPERATIVE REPORT  SURGEON: Rod Can, MD   ASSISTANT: Cherlynn June, PA-C.  PREOPERATIVE DIAGNOSIS: Right hip arthritis.   POSTOPERATIVE DIAGNOSIS: Right hip arthritis.   PROCEDURE: Right total hip arthroplasty, anterior approach.   IMPLANTS: DePuy Tri Lock stem, size 6, hi offset. DePuy Pinnacle Cup, size 52 mm. DePuy Altrx liner, size 32 by 52 mm, neutral. DePuy Biolox ceramic head ball, size 32 + 9 mm.  ANESTHESIA:  MAC and Spinal  ESTIMATED BLOOD LOSS:-175 mL    ANTIBIOTICS: 2g Ancef.  DRAINS: None.  COMPLICATIONS: None.   CONDITION: PACU - hemodynamically stable.   BRIEF CLINICAL NOTE: Julia Stewart is a 60 y.o. female with a long-standing history of Left hip arthritis. After failing conservative management, the patient was indicated for total hip arthroplasty. The risks, benefits, and alternatives to the procedure were explained, and the patient elected to proceed.  PROCEDURE IN DETAIL: Surgical site was marked by myself in the pre-op holding area. Once inside the operating room, spinal anesthesia was obtained, and a foley catheter was inserted. The patient was then positioned on the Hana table.  All bony prominences were well padded.  The hip was prepped and draped in the normal sterile surgical fashion.  A time-out was called verifying side and site of surgery. The patient received IV antibiotics within 60 minutes of beginning the procedure.   The direct anterior approach to the hip was performed through the Hueter interval.  Lateral femoral circumflex vessels were treated with the Auqumantys. The anterior capsule was exposed and an inverted T capsulotomy was made. The femoral neck cut was made to the level of the templated cut.  A corkscrew was placed into the head and the head was removed.  The femoral head was found to have eburnated bone. The head was passed to the back table and was measured.   Acetabular exposure was achieved, and the pulvinar  and labrum were excised. Sequential reaming of the acetabulum was then performed up to a size 51 mm reamer. A 52 mm cup was then opened and impacted into place at approximately 40 degrees of abduction and 20 degrees of anteversion. The final polyethylene liner was impacted into place and acetabular osteophytes were removed.    I then gained femoral exposure taking care to protect the abductors and greater trochanter.  This was performed using standard external rotation, extension, and adduction.  The capsule was peeled off the inner aspect of the greater trochanter, taking care to preserve the short external rotators. A cookie cutter was used to enter the femoral canal, and then the femoral canal finder was placed.  Sequential broaching was performed up to a size 6.  Calcar planer was used on the femoral neck remnant.  I placed a hi offset neck and a trial head ball.  The hip was reduced.  Leg lengths and offset were checked fluoroscopically.  The hip was dislocated and trial components were removed.  The final implants were placed, and the hip was reduced.  Fluoroscopy was used to confirm component position and leg lengths.  At 90 degrees of external rotation and full extension, the hip was stable to an anterior directed force.   The wound was copiously irrigated with Irrisept solution and normal saline using pule lavage.  Marcaine solution was injected into the periarticular soft tissue.  The wound was closed in layers using #1 Stratafix for the fascia, 2-0 Vicryl for the subcutaneous fat, 2-0 Monocryl for the deep dermal layer, 3-0 running Monocryl subcuticular stitch,  and Dermabond for the skin.  Once the glue was fully dried, an Aquacell Ag dressing was applied.  The patient was transported to the recovery room in stable condition.  Sponge, needle, and instrument counts were correct at the end of the case x2.  The patient tolerated the procedure well and there were no known complications.  Please note  that a surgical assistant was a medical necessity for this procedure to perform it in a safe and expeditious manner. Assistant was necessary to provide appropriate retraction of vital neurovascular structures, to prevent femoral fracture, and to allow for anatomic placement of the prosthesis.

## 2020-05-27 NOTE — Discharge Instructions (Signed)
°Dr. Jaedon Siler °Joint Replacement Specialist °[facility] °[address] °[place], [place] [address] °[phone] ° ° °TOTAL HIP REPLACEMENT POSTOPERATIVE DIRECTIONS ° ° ° °Hip Rehabilitation, Guidelines Following Surgery  ° °WEIGHT BEARING °Weight bearing as tolerated with assist device (walker, cane, etc) as directed, use it as long as suggested by your surgeon or therapist, typically at least 4-6 weeks. ° °The results of a hip operation are greatly improved after range of motion and muscle strengthening exercises. Follow all safety measures which are given to protect your hip. If any of these exercises cause increased pain or swelling in your joint, decrease the amount until you are comfortable again. Then slowly increase the exercises. Call your caregiver if you have problems or questions.  ° °HOME CARE INSTRUCTIONS  °Most of the following instructions are designed to prevent the dislocation of your new hip.  °Remove items at home which could result in a fall. This includes throw rugs or furniture in walking pathways.  °Continue medications as instructed at time of discharge. °· You may have some home medications which will be placed on hold until you complete the course of blood thinner medication. °· You may start showering once you are discharged home. Do not remove your dressing. °Do not put on socks or shoes without following the instructions of your caregivers.   °Sit on chairs with arms. Use the chair arms to help push yourself up when arising.  °Arrange for the use of a toilet seat elevator so you are not sitting low.  °· Walk with walker as instructed.  °You may resume a sexual relationship in one month or when given the OK by your caregiver.  °Use walker as long as suggested by your caregivers.  °You may put full weight on your legs and walk as much as is comfortable. °Avoid periods of inactivity such as sitting longer than an hour when not asleep. This helps prevent  blood clots.  °You may return to work once you are cleared by your surgeon.  °Do not drive a car for 6 weeks or until released by your surgeon.  °Do not drive while taking narcotics.  °Wear elastic stockings for two weeks following surgery during the day but you may remove then at night.  °Make sure you keep all of your appointments after your operation with all of your doctors and caregivers. You should call the office at the above phone number and make an appointment for approximately two weeks after the date of your surgery. °Please pick up a stool softener and laxative for home use as long as you are requiring pain medications. °· ICE to the affected hip every three hours for 30 minutes at a time and then as needed for pain and swelling. Continue to use ice on the hip for pain and swelling from surgery. You may notice swelling that will progress down to the foot and ankle.  This is normal after surgery.  Elevate the leg when you are not up walking on it.   °It is important for you to complete the blood thinner medication as prescribed by your doctor. °· Continue to use the breathing machine which will help keep your temperature down.  It is common for your temperature to cycle up and down following surgery, especially at night when you are not up moving around and exerting yourself.  The breathing machine keeps your lungs expanded and your temperature down. ° °RANGE OF MOTION AND STRENGTHENING EXERCISES  °These exercises are   designed to help you keep full movement of your hip joint. Follow your caregiver's or physical therapist's instructions. Perform all exercises about fifteen times, three times per day or as directed. Exercise both hips, even if you have had only one joint replacement. These exercises can be done on a training (exercise) mat, on the floor, on a table or on a bed. Use whatever works the best and is most comfortable for you. Use music or television while you are exercising so that the exercises  are a pleasant break in your day. This will make your life better with the exercises acting as a break in routine you can look forward to.  °Lying on your back, slowly slide your foot toward your buttocks, raising your knee up off the floor. Then slowly slide your foot back down until your leg is straight again.  °Lying on your back spread your legs as far apart as you can without causing discomfort.  °Lying on your side, raise your upper leg and foot straight up from the floor as far as is comfortable. Slowly lower the leg and repeat.  °Lying on your back, tighten up the muscle in the front of your thigh (quadriceps muscles). You can do this by keeping your leg straight and trying to raise your heel off the floor. This helps strengthen the largest muscle supporting your knee.  °Lying on your back, tighten up the muscles of your buttocks both with the legs straight and with the knee bent at a comfortable angle while keeping your heel on the floor.  ° °SKILLED REHAB INSTRUCTIONS: °If the patient is transferred to a skilled rehab facility following release from the hospital, a list of the current medications will be sent to the facility for the patient to continue.  When discharged from the skilled rehab facility, please have the facility set up the patient's Home Health Physical Therapy prior to being released. Also, the skilled facility will be responsible for providing the patient with their medications at time of release from the facility to include their pain medication and their blood thinner medication. If the patient is still at the rehab facility at time of the two week follow up appointment, the skilled rehab facility will also need to assist the patient in arranging follow up appointment in our office and any transportation needs. ° °MAKE SURE YOU:  °Understand these instructions.  °Will watch your condition.  °Will get help right away if you are not doing well or get worse. ° °Pick up stool softner and  laxative for home use following surgery while on pain medications. °Do not remove your dressing. °The dressing is waterproof--it is OK to take showers. °Continue to use ice for pain and swelling after surgery. °Do not use any lotions or creams on the incision until instructed by your surgeon. °Total Hip Protocol. ° ° °

## 2020-05-28 ENCOUNTER — Encounter (HOSPITAL_COMMUNITY): Payer: Self-pay | Admitting: Orthopedic Surgery
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
## 2021-02-07 IMAGING — DX DG PORTABLE PELVIS
1 series · 1 of 1 positions shown · non-contrast
Comparison: None.

CLINICAL DATA: Status post right total hip replacement.

EXAM:
PORTABLE PELVIS 1-2 VIEWS

[pelvis ap]
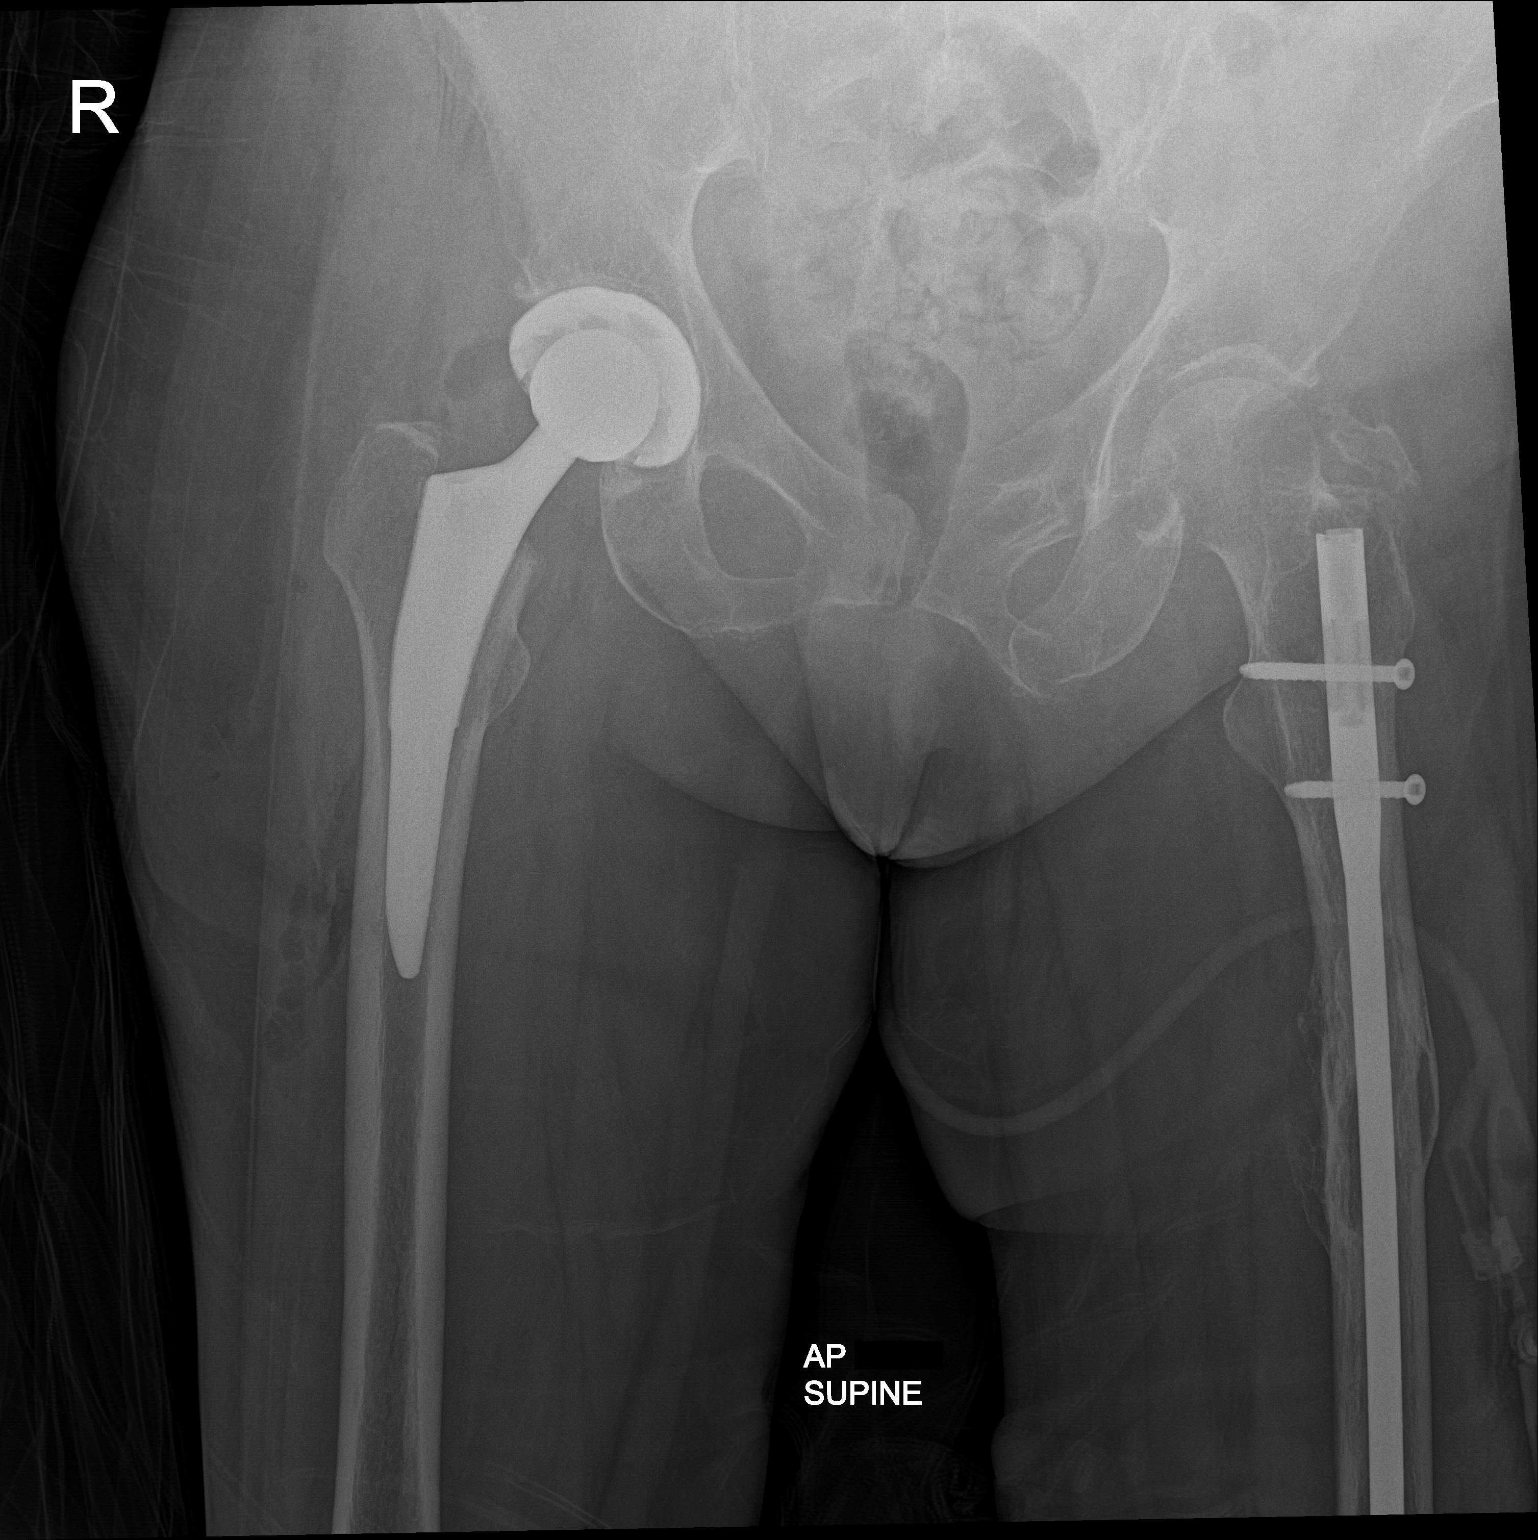

[1 of 1 positions shown; findings below may reference images not displayed]

FINDINGS: The right femoral and acetabular components are well situated.
Expected postoperative changes are noted in the surrounding soft
tissues.
IMPRESSION: Status post right total hip arthroplasty.

## 2022-07-04 ENCOUNTER — Other Ambulatory Visit: Payer: Self-pay

## 2022-07-04 DIAGNOSIS — M79671 Pain in right foot: Secondary | ICD-10-CM

## 2022-07-06 ENCOUNTER — Ambulatory Visit: Payer: Medicare PPO | Admitting: Vascular Surgery

## 2022-07-20 ENCOUNTER — Ambulatory Visit (INDEPENDENT_AMBULATORY_CARE_PROVIDER_SITE_OTHER): Payer: Medicare PPO

## 2022-07-20 ENCOUNTER — Ambulatory Visit: Payer: Medicare PPO | Admitting: Vascular Surgery

## 2022-07-20 ENCOUNTER — Encounter: Payer: Self-pay | Admitting: Vascular Surgery

## 2022-07-20 VITALS — BP 165/100 | HR 72 | Temp 98.1°F | Ht 67.0 in | Wt 173.8 lb

## 2022-07-20 DIAGNOSIS — I739 Peripheral vascular disease, unspecified: Secondary | ICD-10-CM | POA: Diagnosis not present

## 2022-07-20 DIAGNOSIS — M79671 Pain in right foot: Secondary | ICD-10-CM

## 2022-07-20 LAB — VAS US ABI WITH/WO TBI
Left ABI: 1.02
Right ABI: 1.05
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
## 2022-12-12 ENCOUNTER — Other Ambulatory Visit: Payer: Self-pay
